# Patient Record
Sex: Male | Born: 1955 | Race: White | Hispanic: No | Marital: Married | Smoking: Current some day smoker
Health system: Southern US, Community
[De-identification: ages and names within clinical notes are randomized; demographics above are authoritative.]

## PROBLEM LIST (undated history)

## (undated) DIAGNOSIS — E785 Hyperlipidemia, unspecified: Secondary | ICD-10-CM

## (undated) DIAGNOSIS — E119 Type 2 diabetes mellitus without complications: Secondary | ICD-10-CM

## (undated) DIAGNOSIS — I509 Heart failure, unspecified: Secondary | ICD-10-CM

## (undated) DIAGNOSIS — I10 Essential (primary) hypertension: Secondary | ICD-10-CM

## (undated) DIAGNOSIS — I639 Cerebral infarction, unspecified: Secondary | ICD-10-CM

## (undated) DIAGNOSIS — I6529 Occlusion and stenosis of unspecified carotid artery: Secondary | ICD-10-CM

## (undated) HISTORY — DX: Occlusion and stenosis of unspecified carotid artery: I65.29

## (undated) HISTORY — DX: Hyperlipidemia, unspecified: E78.5

## (undated) HISTORY — PX: OTHER SURGICAL HISTORY: SHX169

## (undated) HISTORY — PX: CORONARY STENT PLACEMENT: SHX1402

## (undated) HISTORY — DX: Essential (primary) hypertension: I10

## (undated) HISTORY — DX: Type 2 diabetes mellitus without complications: E11.9

## (undated) HISTORY — PX: AORTIC VALVE REPLACEMENT: SHX41

## (undated) HISTORY — PX: HIP SURGERY: SHX245

## (undated) HISTORY — DX: Cerebral infarction, unspecified: I63.9

## (undated) HISTORY — DX: Heart failure, unspecified: I50.9

---

## 2005-03-06 ENCOUNTER — Ambulatory Visit: Payer: Self-pay | Admitting: General Practice

## 2005-03-13 ENCOUNTER — Ambulatory Visit: Payer: Self-pay | Admitting: Psychiatry

## 2005-04-07 ENCOUNTER — Emergency Department (HOSPITAL_COMMUNITY): Admission: EM | Admit: 2005-04-07 | Discharge: 2005-04-07 | Payer: Self-pay | Admitting: Emergency Medicine

## 2005-04-07 ENCOUNTER — Inpatient Hospital Stay: Payer: Self-pay | Admitting: Internal Medicine

## 2005-04-07 ENCOUNTER — Emergency Department: Payer: Self-pay | Admitting: General Practice

## 2006-10-14 ENCOUNTER — Inpatient Hospital Stay: Payer: Self-pay | Admitting: Internal Medicine

## 2008-03-24 ENCOUNTER — Ambulatory Visit: Payer: Self-pay | Admitting: Cardiology

## 2008-03-31 ENCOUNTER — Inpatient Hospital Stay: Payer: Self-pay | Admitting: Cardiology

## 2009-07-20 ENCOUNTER — Ambulatory Visit: Payer: Self-pay | Admitting: Internal Medicine

## 2012-12-22 DIAGNOSIS — M109 Gout, unspecified: Secondary | ICD-10-CM | POA: Insufficient documentation

## 2012-12-22 DIAGNOSIS — Z96649 Presence of unspecified artificial hip joint: Secondary | ICD-10-CM | POA: Insufficient documentation

## 2012-12-22 DIAGNOSIS — Z955 Presence of coronary angioplasty implant and graft: Secondary | ICD-10-CM | POA: Insufficient documentation

## 2012-12-22 DIAGNOSIS — I2510 Atherosclerotic heart disease of native coronary artery without angina pectoris: Secondary | ICD-10-CM | POA: Insufficient documentation

## 2013-02-10 DIAGNOSIS — I4891 Unspecified atrial fibrillation: Secondary | ICD-10-CM | POA: Insufficient documentation

## 2013-02-25 DIAGNOSIS — H53469 Homonymous bilateral field defects, unspecified side: Secondary | ICD-10-CM | POA: Insufficient documentation

## 2013-02-25 DIAGNOSIS — Z95810 Presence of automatic (implantable) cardiac defibrillator: Secondary | ICD-10-CM | POA: Insufficient documentation

## 2013-04-13 DIAGNOSIS — I509 Heart failure, unspecified: Secondary | ICD-10-CM | POA: Insufficient documentation

## 2013-12-21 ENCOUNTER — Encounter: Payer: Self-pay | Admitting: Unknown Physician Specialty

## 2014-01-11 ENCOUNTER — Encounter: Payer: Self-pay | Admitting: Unknown Physician Specialty

## 2014-12-07 ENCOUNTER — Other Ambulatory Visit: Payer: Self-pay | Admitting: Cardiology

## 2014-12-08 ENCOUNTER — Inpatient Hospital Stay: Payer: Self-pay | Admitting: Cardiology

## 2015-01-03 ENCOUNTER — Ambulatory Visit
Admit: 2015-01-03 | Disposition: A | Discharge status: Home / Self Care | Payer: Self-pay | Attending: Internal Medicine | Admitting: Internal Medicine

## 2015-01-12 ENCOUNTER — Ambulatory Visit
Admit: 2015-01-12 | Disposition: A | Discharge status: Home / Self Care | Payer: Self-pay | Attending: Internal Medicine | Admitting: Internal Medicine

## 2015-01-18 ENCOUNTER — Encounter
Admit: 2015-01-18 | Disposition: A | Discharge status: Home / Self Care | Payer: Self-pay | Attending: Cardiology | Admitting: Cardiology

## 2015-02-11 NOTE — Discharge Summary (Signed)
Dates of Admission and Diagnosis:  Date of Admission 08-Dec-2014   Date of Discharge 17-Dec-2014   Admitting Diagnosis Chest pain   Final Diagnosis coronary artery disease   Discharge Diagnosis 1 coronary artery disease   2 ischemic cardiomyopathy   3 prosthetic aortic valve    Chief Complaint/History of Present Illness patient was admitted to [facility] after presenting to our office with complaints of chest pain.  He ruled out from myocardia infarction.  After normalizing he has INR, he underwent left cardiac catheterization which revealed a 90% proximal LAD stenosis.  He underwent percutaneous coronary intervention with placement of a bare metal stent.  He was placed on clopidogrel and resumed warfarin.  He remained on IV heparin until his INR was therapeutic.  He did fairly well during the hospitalization with no further chest pain.  No evidence of heart failure.  No complications from the cardiac catheterization or PCI   Allergies:  Morphine: Other  Pertinent Past History:  Pertinent Past History coronary artery disease Congestive heart failure The aortic valve disease status post aortic valve replacement The AICD placement   Hospital Course:  Hospital Course as per above.  Patient did well pre and post cardiac catheterization.  Remained on IV heparin until his INR normalizes a mildly cardiac catheterization and PCI.  After cardiac catheterization revealed a proximal LAD lesion underwent PCI with placement of a bare metal stent in the proximal LAD.  He did well with this and was placed on clopidogrel in addition to aspirin.  Remained on heparin until his INR  returnedback to the therapeutic range he had no complications from the procedure.   Condition on Discharge Good   Code Status:  Code Status Full Code   DISCHARGE INSTRUCTIONS HOME MEDS:  Medication Reconciliation: Patient's Home Medications at Discharge:     Medication Instructions   finasteride 5 mg oral tablet  take 1 tablet by mouth every day   at 11am   diazepam 5 mg oral tablet  take 1 tablet by mouth every day    aspirin 81 mg oral tablet  1 tab(s) orally once a day (in the evening)   acetaminophen-oxycodone 325 mg-5 mg oral tablet  2 tab(s) orally 3 times a day   oxycontin 20 mg oral tablet, extended release  1 orally 2 times a day, As Needed - for Pain   zolpidem 10 mg oral tablet  1 tab(s) orally once a day (at bedtime), As Needed - for Inability to Sleep   metformin 500 mg oral tablet  1 tab(s) orally 3 times a day   coreg 12.5 mg oral tablet  1 tab(s) orally 2 times a day   lisinopril 20 mg oral tablet  1  orally once a day (in the morning)   nitroglycerin 0.4mg  every 5 minutes; max of 3 tabs in 15 minutes  1 tab(s) sublingual once a day, As Needed - for Chest Pain   celebrex 200 mg oral capsule  1 tab(s) orally once a day   lantus 100 units/ml subcutaneous solution  24 unit(s) subcutaneous once a day (at bedtime)   novolog 100 units/ml subcutaneous solution  per sliding scale    spironolactone 25 mg oral tablet  0.5 tablet orally once a day   tikosyn 500 mcg oral capsule  1 cap(s) orally 2 times a day at 0730 and 1930   plavix 75 mg oral tablet  1 tab(s) orally once a day   promethazine 25 mg oral tablet  1 tab(s) orally once a day, As Needed - for Nausea, Vomiting   nasacort 55 mcg/inh nasal spray  2 spray(s) nasal once a day, As Needed   systane - ophthalmic solution  1 drop(s) to each affected eye 2 times a day, As Needed dry eye    warfarin 1 mg oral tablet  7.5 tab(s) orally once a day   victoza 18 mg/3 ml subcutaneous solution  0.6 milligram(s) subcutaneous once a day (at bedtime)    PRESCRIPTIONS: PRINTED AND PLACED ON CHART  STOP TAKING THE FOLLOWING MEDICATION(S):    cetirizine 10 mg oral capsule: 1 cap(s) orally once a day  Physician's Instructions:  Home Health? No   Treatments None   Dressing Care Replace dressing as necessary.  May  shower.   Diet Carbohydrate Controlled (ADA) Diet   Dietary Supplements None   Diet Consistency Regular Consistency   Activity Limitations No exertional activity  No heavy lifting   Return to Work Not Applicable   Time frame for Follow Up Appointment 1-2 weeks   Electronic Signatures: Teodoro Spray (MD)  (Signed 23-Mar-16 16:05)  Authored: ADMISSION DATE AND DIAGNOSIS, CHIEF COMPLAINT/HPI, Allergies, PERTINENT PAST HISTORY, HOSPITAL COURSE, DISCHARGE INSTRUCTIONS HOME MEDS, PATIENT INSTRUCTIONS   Last Updated: 23-Mar-16 16:05 by Teodoro Spray (MD)

## 2015-02-12 ENCOUNTER — Ambulatory Visit: Payer: Self-pay

## 2015-02-14 ENCOUNTER — Ambulatory Visit: Payer: Self-pay

## 2015-02-15 ENCOUNTER — Ambulatory Visit: Payer: Self-pay

## 2015-02-19 ENCOUNTER — Ambulatory Visit: Payer: Self-pay

## 2015-02-21 ENCOUNTER — Ambulatory Visit: Payer: Self-pay

## 2015-02-22 ENCOUNTER — Ambulatory Visit: Payer: Self-pay

## 2015-02-26 ENCOUNTER — Ambulatory Visit: Payer: Self-pay

## 2015-02-28 ENCOUNTER — Ambulatory Visit: Payer: Self-pay

## 2015-02-28 NOTE — Progress Notes (Signed)
Discharge Summary  Patient Details  Name: BERGEN MAGNER MRN: [id number] Date of Birth: Sep 17, 1956 Referring Provider:  No ref. provider found   Number of Visits: 35  Reason for Discharge:  Early Exit:  Personal  Smoking History:  History  Smoking status  . Not on file  Smokeless tobacco  . Not on file    Diagnosis:  PTCA Initial Exercise Prescription:   Discharge Exercise Prescription:   Functional Capacity:   Psychological, QOL, Others - Outcomes: PHQ 2/9: No flowsheet data found.  Quality of Life:   Personal Goals: Goals established at orientation with interventions provided to work toward goal.    Personal Goals Discharge:   Nutrition & Weight - Outcomes:    Nutrition:   Nutrition Discharge:   Education Questionnaire Score:   Goals reviewed with patient; copy given to patient.

## 2015-02-28 NOTE — Progress Notes (Signed)
Patient only did orientation appt for Cardiac Rehab. I called him today and his wife answered and said he is exercising at home by walking in the neighborhood.

## 2015-03-01 ENCOUNTER — Ambulatory Visit: Payer: Self-pay

## 2015-03-05 ENCOUNTER — Ambulatory Visit: Payer: Self-pay

## 2015-03-07 ENCOUNTER — Ambulatory Visit: Payer: Self-pay

## 2015-03-08 ENCOUNTER — Encounter: Payer: Self-pay | Admitting: *Deleted

## 2015-03-08 ENCOUNTER — Ambulatory Visit: Payer: Self-pay

## 2015-03-14 ENCOUNTER — Ambulatory Visit: Payer: Self-pay

## 2015-03-15 ENCOUNTER — Ambulatory Visit: Payer: Self-pay

## 2015-03-19 ENCOUNTER — Ambulatory Visit: Payer: Self-pay

## 2015-03-21 ENCOUNTER — Ambulatory Visit: Payer: Self-pay

## 2015-03-22 ENCOUNTER — Ambulatory Visit: Payer: Self-pay

## 2015-03-26 ENCOUNTER — Ambulatory Visit: Payer: Self-pay

## 2015-03-28 ENCOUNTER — Ambulatory Visit: Payer: Self-pay

## 2015-03-29 ENCOUNTER — Ambulatory Visit: Payer: Self-pay

## 2015-04-02 ENCOUNTER — Ambulatory Visit: Payer: Self-pay

## 2015-04-04 ENCOUNTER — Ambulatory Visit: Payer: Self-pay

## 2015-04-05 ENCOUNTER — Ambulatory Visit: Payer: Self-pay

## 2015-04-09 ENCOUNTER — Ambulatory Visit: Payer: Self-pay

## 2015-04-11 ENCOUNTER — Ambulatory Visit: Payer: Self-pay

## 2015-04-12 ENCOUNTER — Ambulatory Visit: Payer: Self-pay

## 2016-03-06 DIAGNOSIS — E1140 Type 2 diabetes mellitus with diabetic neuropathy, unspecified: Secondary | ICD-10-CM | POA: Insufficient documentation

## 2016-04-02 DIAGNOSIS — E782 Mixed hyperlipidemia: Secondary | ICD-10-CM | POA: Insufficient documentation

## 2016-07-04 ENCOUNTER — Other Ambulatory Visit: Payer: Self-pay | Admitting: Internal Medicine

## 2016-07-04 DIAGNOSIS — M48061 Spinal stenosis, lumbar region without neurogenic claudication: Secondary | ICD-10-CM

## 2016-07-10 ENCOUNTER — Ambulatory Visit
Admission: RE | Admit: 2016-07-10 | Discharge: 2016-07-10 | Disposition: A | Discharge status: Home / Self Care | Payer: Managed Care, Other (non HMO) | Source: Ambulatory Visit | Attending: Internal Medicine | Admitting: Internal Medicine

## 2016-07-10 DIAGNOSIS — M5137 Other intervertebral disc degeneration, lumbosacral region: Secondary | ICD-10-CM | POA: Insufficient documentation

## 2016-07-10 DIAGNOSIS — M48061 Spinal stenosis, lumbar region without neurogenic claudication: Secondary | ICD-10-CM

## 2016-07-10 DIAGNOSIS — M4806 Spinal stenosis, lumbar region: Secondary | ICD-10-CM | POA: Insufficient documentation

## 2016-08-11 ENCOUNTER — Encounter (INDEPENDENT_AMBULATORY_CARE_PROVIDER_SITE_OTHER): Payer: Managed Care, Other (non HMO) | Admitting: Vascular Surgery

## 2016-08-11 ENCOUNTER — Encounter (INDEPENDENT_AMBULATORY_CARE_PROVIDER_SITE_OTHER): Payer: Managed Care, Other (non HMO)

## 2016-08-11 ENCOUNTER — Encounter (INDEPENDENT_AMBULATORY_CARE_PROVIDER_SITE_OTHER): Payer: Self-pay | Admitting: Vascular Surgery

## 2016-09-15 ENCOUNTER — Ambulatory Visit (INDEPENDENT_AMBULATORY_CARE_PROVIDER_SITE_OTHER): Payer: Managed Care, Other (non HMO)

## 2016-09-15 ENCOUNTER — Encounter (INDEPENDENT_AMBULATORY_CARE_PROVIDER_SITE_OTHER): Payer: Self-pay | Admitting: Vascular Surgery

## 2016-09-15 ENCOUNTER — Ambulatory Visit (INDEPENDENT_AMBULATORY_CARE_PROVIDER_SITE_OTHER): Payer: Managed Care, Other (non HMO) | Admitting: Vascular Surgery

## 2016-09-15 VITALS — BP 152/89 | HR 93 | Resp 17 | Ht 66.0 in | Wt 195.0 lb

## 2016-09-15 DIAGNOSIS — M79604 Pain in right leg: Secondary | ICD-10-CM | POA: Diagnosis not present

## 2016-09-15 DIAGNOSIS — M79605 Pain in left leg: Secondary | ICD-10-CM | POA: Diagnosis not present

## 2016-09-15 DIAGNOSIS — M4726 Other spondylosis with radiculopathy, lumbar region: Secondary | ICD-10-CM | POA: Diagnosis not present

## 2016-09-15 DIAGNOSIS — M79606 Pain in leg, unspecified: Secondary | ICD-10-CM | POA: Insufficient documentation

## 2016-09-15 DIAGNOSIS — I739 Peripheral vascular disease, unspecified: Secondary | ICD-10-CM

## 2016-09-15 DIAGNOSIS — M47816 Spondylosis without myelopathy or radiculopathy, lumbar region: Secondary | ICD-10-CM | POA: Insufficient documentation

## 2016-09-15 NOTE — Progress Notes (Signed)
MRN : [id number]  Michael Patrick is a 60 y.o. (05-03-1956) male who presents with chief complaint of  Chief Complaint  Patient presents with  . New Patient (Initial Visit)  .  History of Present Illness: The patient is seen for evaluation of painful lower extremities. Patient notes the pain is variable and not always associated with activity.  The pain is somewhat consistent day to day occurring on most days. The patient notes the pain also occurs with standing and routinely seems worse as the day wears on. He notes the pain is much less when he is up and walking or working in the yard.  Sitting in a recliner is the worst.  The pain has been progressive over the past several years. The patient states these symptoms are causing  a profound negative impact on quality of life and daily activities.  The patient denies rest pain or dangling of an extremity off the side of the bed during the night for relief. No open wounds or sores at this time. No history of DVT or phlebitis. No prior interventions or surgeries.  There is a  history of back problems and DJD of the lumbar and sacral spine. This is currently under evaluation.  ABI's Rt=0.94 and Lt=0.71 with biphasic signal bilaterally   Current Meds  Medication Sig  . aspirin EC 81 MG tablet Take by mouth.  . carvedilol (COREG) 12.5 MG tablet   . celecoxib (CELEBREX) 200 MG capsule TAKE ONE TABLET BY MOUTH EVERY DAY  . Cinnamon Bark POWD by Does not apply route.  . clopidogrel (PLAVIX) 75 MG tablet TAKE ONE TABLET BY MOUTH EVERY MORNING  . Cyanocobalamin (VITAMIN B-12) 5000 MCG SUBL Place under the tongue.  . diazepam (VALIUM) 5 MG tablet Take by mouth.  . Dulaglutide 0.75 MG/0.5ML SOPN Inject into the skin.  . finasteride (PROSCAR) 5 MG tablet TAKE ONE TABLET BY MOUTH EVERY DAY  . lisinopril (PRINIVIL,ZESTRIL) 40 MG tablet TAKE ONE TABLET EVERY DAY  . metFORMIN (GLUCOPHAGE-XR) 500 MG 24 hr tablet 4 TABLETS BY MOUTH AT BEDTIME  .  niacin 250 MG tablet Take by mouth.  . nitroGLYCERIN (NITROSTAT) 0.4 MG SL tablet PLACE 1 TABLET UNDER TONGUE EVERY 5 MIN AS NEEDED FOR CHEST PAIN IF NO RELIEF IN15 MIN CALL 911 (MAX 3 TABS)  . ondansetron (ZOFRAN) 4 MG tablet Take by mouth.  . oxyCODONE-acetaminophen (PERCOCET/ROXICET) 5-325 MG tablet   . OXYCONTIN 20 MG 12 hr tablet   . Polyethyl Glycol-Propyl Glycol 0.4-0.3 % SOLN Apply to eye.  . promethazine (PHENERGAN) 25 MG tablet Take by mouth.  . spironolactone (ALDACTONE) 25 MG tablet   . TIKOSYN 500 MCG capsule   . warfarin (COUMADIN) 1 MG tablet TAKE ONE TABLET AT BEDTIME  . warfarin (COUMADIN) 6 MG tablet     Past Medical History:  Diagnosis Date  . Carotid artery occlusion   . CHF (congestive heart failure) ([place])   . Diabetes mellitus without complication ([place])   . Hyperlipidemia   . Hypertension   . Stroke [facility])     Past Surgical History:  Procedure Laterality Date  . AORTIC VALVE REPLACEMENT    . CORONARY STENT PLACEMENT    . HIP SURGERY      Social History Social History  Substance Use Topics  . Smoking status: Former Research scientist (life sciences)  . Smokeless tobacco: Never Used  . Alcohol use Yes     Comment: occassionally    Family History Family History  Problem Relation  Age of Onset  . Hypertension Mother   . Hypertension Father   No family history of bleeding/clotting disorders, porphyria or autoimmune disease   Allergies  Allergen Reactions  . Morphine And Related      REVIEW OF SYSTEMS (Negative unless checked)  Constitutional: [] Weight loss  [] Fever  [] Chills Cardiac: [] Chest pain   [] Chest pressure   [] Palpitations   [] Shortness of breath when laying flat   [] Shortness of breath with exertion. Vascular:  [] Pain in legs with walking   [x] Pain in legs at rest  [] History of DVT   [] Phlebitis   [] Swelling in legs   [] Varicose veins   [] Non-healing ulcers Pulmonary:   [] Uses home oxygen   [] Productive cough   [] Hemoptysis   [] Wheeze  [] COPD    [] Asthma Neurologic:  [] Dizziness   [] Seizures   [x] History of stroke   [x] History of TIA  [] Aphasia   [] Vissual changes   [] Weakness or numbness in arm   [] Weakness or numbness in leg Musculoskeletal:   [] Joint swelling   [x] Joint pain   [x] Low back pain Hematologic:  [] Easy bruising  [] Easy bleeding   [] Hypercoagulable state   [] Anemic Gastrointestinal:  [] Diarrhea   [] Vomiting  [] Gastroesophageal reflux/heartburn   [] Difficulty swallowing. Genitourinary:  [] Chronic kidney disease   [] Difficult urination  [] Frequent urination   [] Blood in urine Skin:  [] Rashes   [] Ulcers  Psychological:  [] History of anxiety   []  History of major depression.  Physical Examination  Vitals:   09/15/16 1509  BP: (!) 152/89  Pulse: 93  Resp: 17  Weight: 195 lb (88.5 kg)  Height: 5\' 6"  (1.676 m)   Body mass index is 31.47 kg/m. Gen: WD/WN, NAD Head: [place]/AT, No temporalis wasting.  Ear/Nose/Throat: Hearing grossly intact, nares w/o erythema or drainage, poor dentition Eyes: PER, EOMI, sclera nonicteric.  Neck: Supple, no masses.  No bruit or JVD.  Pulmonary:  Good air movement, clear to auscultation bilaterally, no use of accessory muscles.  Cardiac: RRR, normal S1, S2, no Murmurs. Vascular:  Mild edema noted bilaterally Vessel Right Left  Radial Palpable Palpable  Ulnar Palpable Palpable  Brachial Palpable Palpable  Carotid Palpable Palpable  Femoral Palpable Palpable  Popliteal Palpable Palpable  PT Not Palpable Not Palpable  DP Not Palpable Not Palpable   Gastrointestinal: soft, non-distended. No guarding/no peritoneal signs.  Musculoskeletal: M/S 5/5 throughout.  No deformity or atrophy.  Neurologic: CN 2-12 intact. Pain and light touch intact in extremities.  Symmetrical.  Speech is fluent. Motor exam as listed above. Psychiatric: Judgment intact, Mood & affect appropriate for pt's clinical situation. Dermatologic: No rashes or ulcers noted.  No changes consistent with cellulitis. Lymph :  No Cervical lymphadenopathy, no lichenification or skin changes of chronic lymphedema.  CBC No results found for: WBC, HGB, HCT, MCV, PLT  BMET No results found for: NA, K, CL, CO2, GLUCOSE, BUN, CREATININE, CALCIUM, GFRNONAA, GFRAA CrCl cannot be calculated (No order found.).  COAG No results found for: INR, PROTIME  Radiology No results found.  Assessment/Plan 1. PAD (peripheral artery disease) (HCC)  Recommend:  The patient has atypical pain symptoms for pure atherosclerotic disease. However, on physical exam there is evidence of arterial disease, given the diminished pulses.  Noninvasive studies including ABI's and possibly arterial ultrasound of the legs will be obtained and the patient will follow up with me to review these studies. (see #2)  The patient should continue walking and begin a more formal exercise program. The patient should continue his antiplatelet therapy  and aggressive treatment of the lipid abnormalities.  The patient should begin wearing graduated compression socks 15-20 mmHg strength to control edema.   2. Pain in both lower extremities Uncertain etiology  He is already scheduled for Nerve conduction studies  And before I move forward with vascular ultraounds I will review these results.  3. Osteoarthritis of spine with radiculopathy, lumbar region See #1 and 2    [name], MD  09/15/2016 5:04 PM

## 2016-09-19 ENCOUNTER — Encounter (INDEPENDENT_AMBULATORY_CARE_PROVIDER_SITE_OTHER): Payer: Self-pay | Admitting: Vascular Surgery

## 2016-09-19 ENCOUNTER — Encounter (INDEPENDENT_AMBULATORY_CARE_PROVIDER_SITE_OTHER): Payer: Self-pay

## 2017-01-19 DIAGNOSIS — E1142 Type 2 diabetes mellitus with diabetic polyneuropathy: Secondary | ICD-10-CM | POA: Insufficient documentation

## 2017-01-19 DIAGNOSIS — F1190 Opioid use, unspecified, uncomplicated: Secondary | ICD-10-CM | POA: Insufficient documentation

## 2017-01-19 DIAGNOSIS — Z8673 Personal history of transient ischemic attack (TIA), and cerebral infarction without residual deficits: Secondary | ICD-10-CM | POA: Insufficient documentation

## 2017-02-25 DIAGNOSIS — Z85820 Personal history of malignant melanoma of skin: Secondary | ICD-10-CM | POA: Insufficient documentation

## 2017-08-21 ENCOUNTER — Other Ambulatory Visit: Payer: Self-pay | Admitting: Unknown Physician Specialty

## 2017-08-21 DIAGNOSIS — M542 Cervicalgia: Principal | ICD-10-CM

## 2017-08-21 DIAGNOSIS — M5440 Lumbago with sciatica, unspecified side: Secondary | ICD-10-CM

## 2017-08-21 DIAGNOSIS — G8929 Other chronic pain: Secondary | ICD-10-CM

## 2017-08-31 ENCOUNTER — Other Ambulatory Visit: Payer: Self-pay | Admitting: Unknown Physician Specialty

## 2017-08-31 ENCOUNTER — Ambulatory Visit
Admission: RE | Admit: 2017-08-31 | Discharge: 2017-08-31 | Disposition: A | Discharge status: Home / Self Care | Payer: Medicare Other | Source: Ambulatory Visit | Attending: Unknown Physician Specialty | Admitting: Unknown Physician Specialty

## 2017-08-31 ENCOUNTER — Encounter
Admission: RE | Admit: 2017-08-31 | Discharge: 2017-08-31 | Disposition: A | Discharge status: Home / Self Care | Payer: Medicare Other | Source: Ambulatory Visit | Attending: Unknown Physician Specialty | Admitting: Unknown Physician Specialty

## 2017-08-31 DIAGNOSIS — M542 Cervicalgia: Principal | ICD-10-CM

## 2017-08-31 DIAGNOSIS — M5440 Lumbago with sciatica, unspecified side: Secondary | ICD-10-CM | POA: Diagnosis not present

## 2017-08-31 DIAGNOSIS — M4802 Spinal stenosis, cervical region: Secondary | ICD-10-CM | POA: Insufficient documentation

## 2017-08-31 DIAGNOSIS — Z96643 Presence of artificial hip joint, bilateral: Secondary | ICD-10-CM

## 2017-08-31 DIAGNOSIS — G8929 Other chronic pain: Secondary | ICD-10-CM

## 2017-08-31 DIAGNOSIS — M5136 Other intervertebral disc degeneration, lumbar region: Secondary | ICD-10-CM | POA: Insufficient documentation

## 2017-08-31 DIAGNOSIS — M5137 Other intervertebral disc degeneration, lumbosacral region: Secondary | ICD-10-CM | POA: Diagnosis not present

## 2017-08-31 DIAGNOSIS — M48061 Spinal stenosis, lumbar region without neurogenic claudication: Secondary | ICD-10-CM | POA: Insufficient documentation

## 2017-08-31 MED ORDER — TECHNETIUM TC 99M MEDRONATE IV KIT
22.7600 | PACK | Freq: Once | INTRAVENOUS | Status: AC | PRN
  Administered 2017-08-31: 22.76 via INTRAVENOUS

## 2017-10-08 ENCOUNTER — Encounter (INDEPENDENT_AMBULATORY_CARE_PROVIDER_SITE_OTHER): Payer: Self-pay

## 2017-10-08 ENCOUNTER — Ambulatory Visit (INDEPENDENT_AMBULATORY_CARE_PROVIDER_SITE_OTHER): Payer: Medicare Other | Admitting: Vascular Surgery

## 2017-10-08 ENCOUNTER — Encounter (INDEPENDENT_AMBULATORY_CARE_PROVIDER_SITE_OTHER): Payer: Self-pay | Admitting: Vascular Surgery

## 2017-10-08 VITALS — BP 134/83 | HR 89 | Resp 16 | Ht 66.5 in | Wt 207.0 lb

## 2017-10-08 DIAGNOSIS — I70213 Atherosclerosis of native arteries of extremities with intermittent claudication, bilateral legs: Secondary | ICD-10-CM

## 2017-10-08 DIAGNOSIS — E119 Type 2 diabetes mellitus without complications: Secondary | ICD-10-CM | POA: Insufficient documentation

## 2017-10-08 DIAGNOSIS — M79606 Pain in leg, unspecified: Secondary | ICD-10-CM

## 2017-10-08 DIAGNOSIS — I70219 Atherosclerosis of native arteries of extremities with intermittent claudication, unspecified extremity: Secondary | ICD-10-CM | POA: Insufficient documentation

## 2017-10-08 DIAGNOSIS — M4726 Other spondylosis with radiculopathy, lumbar region: Secondary | ICD-10-CM

## 2017-10-08 DIAGNOSIS — E1151 Type 2 diabetes mellitus with diabetic peripheral angiopathy without gangrene: Secondary | ICD-10-CM

## 2017-10-08 DIAGNOSIS — I10 Essential (primary) hypertension: Secondary | ICD-10-CM | POA: Insufficient documentation

## 2017-10-26 ENCOUNTER — Encounter (INDEPENDENT_AMBULATORY_CARE_PROVIDER_SITE_OTHER): Payer: Self-pay | Admitting: Vascular Surgery

## 2017-10-26 NOTE — Progress Notes (Signed)
Patient ID: Michael Patrick, male   DOB: 1956/04/26, 62 y.o.   MRN: [id number]  Chief Complaint  Patient presents with  . Follow-up    PAD has worsened    HPI JOHNCARLO Patrick is a 62 y.o. male.  Past Medical History:  Diagnosis Date  . Carotid artery occlusion   . CHF (congestive heart failure) ([place])   . Diabetes mellitus without complication ([place])   . Hyperlipidemia   . Hypertension   . Stroke [facility])     Past Surgical History:  Procedure Laterality Date  . AORTIC VALVE REPLACEMENT    . CORONARY STENT PLACEMENT    . HIP SURGERY        Allergies  Allergen Reactions  . Morphine And Related     Current Outpatient Medications  Medication Sig Dispense Refill  . carvedilol (COREG) 12.5 MG tablet     . celecoxib (CELEBREX) 200 MG capsule TAKE ONE TABLET BY MOUTH EVERY DAY    . Cinnamon Bark POWD by Does not apply route.    . clopidogrel (PLAVIX) 75 MG tablet TAKE ONE TABLET BY MOUTH EVERY MORNING    . Cyanocobalamin (VITAMIN B-12) 5000 MCG SUBL Place under the tongue.    . diazepam (VALIUM) 5 MG tablet Take by mouth.    . Dulaglutide 0.75 MG/0.5ML SOPN Inject into the skin.    . Exenatide ER (BYDUREON) 2 MG PEN Inject into the skin.    . finasteride (PROSCAR) 5 MG tablet TAKE ONE TABLET BY MOUTH EVERY DAY    . furosemide (LASIX) 40 MG tablet Take by mouth.    . gabapentin (NEURONTIN) 800 MG tablet Take by mouth.    Marland Kitchen glimepiride (AMARYL) 4 MG tablet Take by mouth.    Marland Kitchen lisinopril (PRINIVIL,ZESTRIL) 40 MG tablet TAKE ONE TABLET EVERY DAY    . magnesium oxide (MAG-OX) 400 MG tablet Take 400 mg by mouth daily.    . metFORMIN (GLUCOPHAGE-XR) 500 MG 24 hr tablet 4 TABLETS BY MOUTH AT BEDTIME    . niacin 250 MG tablet Take by mouth.    . nitroGLYCERIN (NITROSTAT) 0.4 MG SL tablet PLACE 1 TABLET UNDER TONGUE EVERY 5 MIN AS NEEDED FOR CHEST PAIN IF NO RELIEF IN15 MIN CALL 911 (MAX 3 TABS)    . oxyCODONE-acetaminophen (PERCOCET/ROXICET) 5-325 MG tablet     . OXYCONTIN 20 MG  12 hr tablet     . Polyethyl Glycol-Propyl Glycol 0.4-0.3 % SOLN Apply to eye.    . promethazine (PHENERGAN) 25 MG tablet Take by mouth.    . spironolactone (ALDACTONE) 25 MG tablet     . TIKOSYN 500 MCG capsule     . warfarin (COUMADIN) 1 MG tablet TAKE ONE TABLET AT BEDTIME    . warfarin (COUMADIN) 6 MG tablet     . aspirin EC 81 MG tablet Take by mouth.    . ondansetron (ZOFRAN) 4 MG tablet Take by mouth.    . topiramate (TOPAMAX) 25 MG tablet TAKE 3 TABLETS (75 MG TOTAL) BY MOUTH 2 (TWO) TIMES DAILY  1   No current facility-administered medications for this visit.         Physical Exam BP 134/83 (BP Location: Right Arm, Patient Position: Sitting)   Pulse 89   Resp 16   Ht 5' 6.5" (1.689 m)   Wt 207 lb (93.9 kg)   BMI 32.91 kg/m  Gen:  WD/WN, NAD Skin: incision C/D/I     Assessment/Plan:  No problem-specific Assessment &  Plan notes found for this encounter.      Michael Patrick 10/26/2017, 8:21 PM   This note was created with Dragon medical transcription system.  Any errors from dictation are unintentional.

## 2017-12-03 DIAGNOSIS — Z0000 Encounter for general adult medical examination without abnormal findings: Secondary | ICD-10-CM | POA: Insufficient documentation

## 2018-03-31 ENCOUNTER — Other Ambulatory Visit
Admission: RE | Admit: 2018-03-31 | Discharge: 2018-03-31 | Disposition: A | Discharge status: Home / Self Care | Payer: Medicare Other | Source: Ambulatory Visit | Attending: Gastroenterology | Admitting: Gastroenterology

## 2018-03-31 ENCOUNTER — Other Ambulatory Visit: Payer: Self-pay

## 2018-03-31 DIAGNOSIS — R197 Diarrhea, unspecified: Secondary | ICD-10-CM | POA: Diagnosis present

## 2018-03-31 LAB — GASTROINTESTINAL PANEL BY PCR, STOOL (REPLACES STOOL CULTURE)

## 2018-03-31 LAB — C DIFFICILE QUICK SCREEN W PCR REFLEX
C DIFFICILE (CDIFF) INTERP: NOT DETECTED
C DIFFICILE (CDIFF) TOXIN: NEGATIVE
C Diff antigen: NEGATIVE

## 2018-04-01 ENCOUNTER — Other Ambulatory Visit: Payer: Self-pay | Admitting: Gastroenterology

## 2018-04-01 DIAGNOSIS — R1032 Left lower quadrant pain: Secondary | ICD-10-CM

## 2018-04-01 DIAGNOSIS — R197 Diarrhea, unspecified: Secondary | ICD-10-CM

## 2018-04-01 DIAGNOSIS — R100 Acute abdomen: Secondary | ICD-10-CM

## 2018-04-02 ENCOUNTER — Ambulatory Visit
Admission: RE | Admit: 2018-04-02 | Discharge: 2018-04-02 | Disposition: A | Discharge status: Home / Self Care | Payer: Medicare Other | Source: Ambulatory Visit | Attending: Gastroenterology | Admitting: Gastroenterology

## 2018-04-02 DIAGNOSIS — R197 Diarrhea, unspecified: Secondary | ICD-10-CM | POA: Diagnosis present

## 2018-04-02 DIAGNOSIS — Z96643 Presence of artificial hip joint, bilateral: Secondary | ICD-10-CM | POA: Insufficient documentation

## 2018-04-02 DIAGNOSIS — N200 Calculus of kidney: Secondary | ICD-10-CM | POA: Insufficient documentation

## 2018-04-02 DIAGNOSIS — R1032 Left lower quadrant pain: Secondary | ICD-10-CM | POA: Insufficient documentation

## 2018-04-02 DIAGNOSIS — R100 Acute abdomen: Secondary | ICD-10-CM

## 2018-04-02 DIAGNOSIS — I700 Atherosclerosis of aorta: Secondary | ICD-10-CM | POA: Insufficient documentation

## 2018-04-02 MED ORDER — IOPAMIDOL (ISOVUE-300) INJECTION 61%
100.0000 mL | Freq: Once | INTRAVENOUS | Status: AC | PRN
  Administered 2018-04-02: 100 mL via INTRAVENOUS

## 2018-04-05 NOTE — Progress Notes (Signed)
04/06/2018 7:31 PM   Michael Patrick May 25, 1956 [id number]  Referring provider: Rusty Aus, MD [place] [place], [place] [address]  Chief Complaint  Patient presents with  . Nephrolithiasis    New Patient    HPI: Patient is 62 year old Caucasian male with a history of nephrolithiasis who is referred by Ok Edwards, NPfor further evaluation and management.  Patient recently suffered an episode of possible food poisoning 1 month ago.  He was having nausea, vomiting and diarrhea associated with intermittent periumbilical abdominal pain.  Contrast CT on 04/02/2018 noted bilateral renal calculi but no obstructing ureteral calculi or bladder calculi.  Patient was found to have microscopic hematuria at today's visit.      He does not have a prior history of recurrent urinary tract infections, nephrolithiasis, trauma to the genitourinary tract, BPH or malignancies of the genitourinary tract.   His father had bladder cancer and he was a smoker.  He does not have a family medical history of nephrolithiasis or hematuria.   He is having symptoms of nocturia.  Patient denies any gross hematuria, dysuria or suprapubic/flank pain.  Patient denies any fevers, chills, nausea or vomiting.    He is a smoker.  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   He  has a high BMI.    He underwent a cystoscopy with Dr. Eliberto Ivory in 1998 for hematuria and was placed on finasteride.  He also is very concerned about his erectile dysfunction.  He stated he had tried PDE 5 inhibitors in the past and they were not effective.  He denied any curvature or pain with erections.  PMH: Past Medical History:  Diagnosis Date  . Carotid artery occlusion   . CHF (congestive heart failure) ([place])   . Diabetes mellitus without complication ([place])   . Hyperlipidemia   . Hypertension   . Stroke [facility])     Surgical History: Past  Surgical History:  Procedure Laterality Date  . AORTIC VALVE REPLACEMENT    . CORONARY STENT PLACEMENT    . HIP SURGERY      Home Medications:  Allergies as of 04/06/2018      Reactions   Morphine And Related       Medication List        Accurate as of 04/06/18  7:31 PM. Always use your most recent med list.          aspirin EC 81 MG tablet Take by mouth.   BYDUREON 2 MG Pen Generic drug:  Exenatide ER Inject into the skin.   carvedilol 12.5 MG tablet Commonly known as:  COREG   celecoxib 200 MG capsule Commonly known as:  CELEBREX TAKE ONE TABLET BY MOUTH EVERY DAY   Cinnamon Bark Powd by Does not apply route.   clopidogrel 75 MG tablet Commonly known as:  PLAVIX TAKE ONE TABLET BY MOUTH EVERY MORNING   diazepam 5 MG tablet Commonly known as:  VALIUM Take by mouth.   Dulaglutide 0.75 MG/0.5ML Sopn Inject into the skin.   finasteride 5 MG tablet Commonly known as:  PROSCAR TAKE ONE TABLET BY MOUTH EVERY DAY   furosemide 40 MG tablet Commonly known as:  LASIX Take by mouth.   gabapentin 800 MG tablet Commonly known as:  NEURONTIN Take by mouth.   glimepiride 4 MG tablet Commonly known as:  AMARYL Take by mouth.   lisinopril 40 MG tablet Commonly known as:  PRINIVIL,ZESTRIL TAKE ONE  TABLET EVERY DAY   magnesium oxide 400 MG tablet Commonly known as:  MAG-OX Take 400 mg by mouth daily.   metFORMIN 500 MG 24 hr tablet Commonly known as:  GLUCOPHAGE-XR 4 TABLETS BY MOUTH AT BEDTIME   niacin 250 MG tablet Take by mouth.   NITROSTAT 0.4 MG SL tablet Generic drug:  nitroGLYCERIN PLACE 1 TABLET UNDER TONGUE EVERY 5 MIN AS NEEDED FOR CHEST PAIN IF NO RELIEF IN15 MIN CALL 911 (MAX 3 TABS)   ondansetron 4 MG tablet Commonly known as:  ZOFRAN Take by mouth.   oxyCODONE-acetaminophen 5-325 MG tablet Commonly known as:  PERCOCET/ROXICET   OXYCONTIN 20 mg 12 hr tablet Generic drug:  oxyCODONE   pantoprazole 40 MG tablet Commonly known as:   PROTONIX Take 40 mg by mouth daily.   Polyethyl Glycol-Propyl Glycol 0.4-0.3 % Soln Apply to eye.   promethazine 25 MG tablet Commonly known as:  PHENERGAN Take by mouth.   spironolactone 25 MG tablet Commonly known as:  ALDACTONE   TIKOSYN 500 MCG capsule Generic drug:  dofetilide   topiramate 25 MG tablet Commonly known as:  TOPAMAX TAKE 3 TABLETS (75 MG TOTAL) BY MOUTH 2 (TWO) TIMES DAILY   Vitamin B-12 5000 MCG Subl Place under the tongue.   warfarin 1 MG tablet Commonly known as:  COUMADIN TAKE ONE TABLET AT BEDTIME   warfarin 6 MG tablet Commonly known as:  COUMADIN       Allergies:  Allergies  Allergen Reactions  . Morphine And Related     Family History: Family History  Problem Relation Age of Onset  . Hypertension Mother   . Hypertension Father     Social History:  reports that he has quit smoking. He has never used smokeless tobacco. He reports that he drinks alcohol. He reports that he does not use drugs.  ROS: UROLOGY Frequent Urination?: No Hard to postpone urination?: No Burning/pain with urination?: No Get up at night to urinate?: Yes Leakage of urine?: No Urine stream starts and stops?: No Trouble starting stream?: No Do you have to strain to urinate?: No Blood in urine?: No Urinary tract infection?: No Sexually transmitted disease?: No Injury to kidneys or bladder?: No Painful intercourse?: No Weak stream?: No Erection problems?: Yes Penile pain?: No  Gastrointestinal Nausea?: No Vomiting?: No Indigestion/heartburn?: No Diarrhea?: No Constipation?: No  Constitutional Fever: No Night sweats?: No Weight loss?: No Fatigue?: No  Skin Skin rash/lesions?: No Itching?: No  Eyes Blurred vision?: No Double vision?: No  Ears/Nose/Throat Sore throat?: No Sinus problems?: No  Hematologic/Lymphatic Swollen glands?: No Easy bruising?: Yes  Cardiovascular Leg swelling?: No Chest pain?: No  Respiratory Cough?:  No Shortness of breath?: No  Endocrine Excessive thirst?: No  Musculoskeletal Back pain?: Yes Joint pain?: No  Neurological Headaches?: No Dizziness?: No  Psychologic Depression?: No Anxiety?: No  Physical Exam: BP 99/64   Pulse 99   Ht 5\' 6"  (1.676 m)   Wt 193 lb (87.5 kg)   BMI 31.15 kg/m   Constitutional:  Well nourished. Alert and oriented, No acute distress. HEENT: [place] AT, moist mucus membranes.  Trachea midline, no masses. Cardiovascular: No clubbing, cyanosis, or edema. Respiratory: Normal respiratory effort, no increased work of breathing. GI: Abdomen is soft, non tender, non distended, no abdominal masses. Liver and spleen not palpable.  No hernias appreciated.  Stool sample for occult testing is not indicated.   GU: No CVA tenderness.  No bladder fullness or masses.  Patient with circumcised  phallus.   Urethral meatus is patent.  No penile discharge. No penile lesions or rashes. Scrotum without lesions, cysts, rashes and/or edema.  Testicles are located scrotally bilaterally. No masses are appreciated in the testicles. Left and right epididymis are normal. Rectal: Patient with  normal sphincter tone. Anus and perineum without scarring or rashes. No rectal masses are appreciated. Prostate is approximately 25 grams, no nodules are appreciated. Seminal vesicles are normal. Skin: No rashes, bruises or suspicious lesions. Lymph: No cervical or inguinal adenopathy. Neurologic: Grossly intact, no focal deficits, moving all 4 extremities. Psychiatric: Normal mood and affect.  Laboratory Data: PSA 0.11 in 09/2016  No results found for: WBC, HGB, HCT, MCV, PLT  No results found for: CREATININE  No results found for: PSA  No results found for: TESTOSTERONE  No results found for: HGBA1C  No results found for: TSH  No results found for: CHOL, HDL, CHOLHDL, VLDL, LDLCALC  No results found for: AST No results found for: ALT No components found for:  ALKALINEPHOPHATASE No components found for: BILIRUBINTOTAL  No results found for: ESTRADIOL  Urinalysis 11-30 RBC's.  See Epic.   I have reviewed the labs.   Pertinent Imaging: CLINICAL DATA:  History of food poisoning 3 weeks ago. Persistent diffuse abdominal pain with nausea, vomiting and diarrhea.  EXAM: CT ABDOMEN AND PELVIS WITH CONTRAST  TECHNIQUE: Multidetector CT imaging of the abdomen and pelvis was performed using the standard protocol following bolus administration of intravenous contrast.  CONTRAST:  179mL ISOVUE-300 IOPAMIDOL (ISOVUE-300) INJECTION 61%  COMPARISON:  None.  FINDINGS: Lower chest: The lung bases are clear. No pleural effusion. The heart is normal in size. No pericardial effusion. Pacer wires are noted.  Hepatobiliary: No focal hepatic lesions or intrahepatic biliary dilatation. The gallbladder is normal. No common bile duct dilatation.  Pancreas: No mass, inflammation or ductal dilatation.  Spleen: Normal size.  No focal lesions.  Adrenals/Urinary Tract: The adrenal glands are normal.  Bilateral renal calculi are noted but no obstructing ureteral calculi or bladder calculi. Both kidneys demonstrate normal enhancement/perfusion. No worrisome renal lesions. The delayed images do not demonstrate any significant collecting system abnormalities. The bladder is normal.  Stomach/Bowel: The stomach, duodenum, small bowel and colon are unremarkable. No acute inflammatory changes, mass lesions or obstructive findings. The terminal ileum and appendix are normal.  Vascular/Lymphatic: Moderate to advanced atherosclerotic calcifications involving the aorta and branch vessels and iliac arteries and branch vessels. No aneurysm or dissection. The branch vessels are patent. The major venous structures are patent.  Small scattered mesenteric and retroperitoneal lymph nodes but no mass or adenopathy.  Reproductive: The prostate gland  and seminal vesicles are unremarkable.  Other: No pelvic mass or adenopathy. No free pelvic fluid collections. No inguinal mass or adenopathy.  Musculoskeletal: Bilateral total hip arthroplasties with associated significant artifact. 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 No acute bony findings or worrisome bone lesions. Stable sclerotic lesion involving the right iliac bone, likely benign bone island. Stable advanced facet disease involving the lumbar spine.  IMPRESSION: 1. No acute abdominal/pelvic findings, mass lesions or adenopathy. 2. Bilateral renal calculi but no obstructing ureteral calculi or bladder calculi. 3. Age advanced atherosclerotic calcifications. 4. 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   Electronically Signed   By: Marijo Sanes M.D.   On: 04/02/2018 14:01  I have independently reviewed the films.    Assessment & Plan:   1.  Microscopic hematuria I explained to the  patient that there are a number of causes that can be associated with blood in the urine, such as stones, BPH, UTI's, damage to the urinary tract and/or cancer. At this time, I felt that the patient warranted further urologic evaluation with 3 or greater RBC's/hpf on microscopic evaluation of the urine.  The AUA guidelines state that a CT urogram is the preferred imaging study to evaluate hematuria.- explained that the CT he had on 04/02/2018 was not a CTU and may miss some urological tumors I explained to the patient that a contrast material will be injected into a vein and that in rare instances, an allergic reaction can result and may even life threatening   The patient denies any allergies to contrast, iodine and/or seafood and is taking metformin. Following the imaging study,  I've recommended a cystoscopy. I described how this is performed, typically in an office setting with a flexible cystoscope. We described the risks,  benefits, and possible side effects, the most common of which is a minor amount of blood in the urine and/or burning which usually resolves in 24 to 48 hours.   The patient had the opportunity to ask questions which were answered. Based upon this discussion, the patient is willing to proceed. Therefore, I've ordered: a CT Urogram and cystoscopy. The patient will return following all of the above for discussion of the results.  UA with 11-30 RBC's Urine culture pending  2. Left renal stones Patient did not want to have any definitive treatment for his left renal stones at this time he would like to table the issue until the fall  3. Right renal stones Small - will continue to monitor   4. ED Offered intracavernosal injections but did advise him due to his anticoagulation status the injections may cause significant bruising in his penis -he declined any further treatment for his ED at this time   Return in about 6 months (around 10/06/2018) for CT Urogram report and cystoscopy.  These notes generated with voice recognition software. I apologize for typographical errors.  Zara Council, PA-C  [facility] [address]  [place], [place] [address] [phone]

## 2018-04-06 ENCOUNTER — Encounter: Payer: Self-pay | Admitting: Urology

## 2018-04-06 ENCOUNTER — Ambulatory Visit (INDEPENDENT_AMBULATORY_CARE_PROVIDER_SITE_OTHER): Payer: Medicare Other | Admitting: Urology

## 2018-04-06 VITALS — BP 99/64 | HR 99 | Ht 66.0 in | Wt 193.0 lb

## 2018-04-06 DIAGNOSIS — R3129 Other microscopic hematuria: Secondary | ICD-10-CM

## 2018-04-06 DIAGNOSIS — N529 Male erectile dysfunction, unspecified: Secondary | ICD-10-CM | POA: Diagnosis not present

## 2018-04-06 DIAGNOSIS — N200 Calculus of kidney: Secondary | ICD-10-CM | POA: Diagnosis not present

## 2018-04-06 LAB — MICROSCOPIC EXAMINATION

## 2018-04-06 LAB — URINALYSIS, COMPLETE
BILIRUBIN UA: NEGATIVE
Ketones, UA: NEGATIVE
LEUKOCYTES UA: NEGATIVE
Nitrite, UA: NEGATIVE
Specific Gravity, UA: 1.02 (ref 1.005–1.030)
Urobilinogen, Ur: 0.2 mg/dL (ref 0.2–1.0)
pH, UA: 6.5 (ref 5.0–7.5)

## 2018-04-09 LAB — CULTURE, URINE COMPREHENSIVE

## 2018-04-13 ENCOUNTER — Encounter: Payer: Self-pay | Admitting: Urology

## 2018-04-13 ENCOUNTER — Ambulatory Visit
Admission: RE | Admit: 2018-04-13 | Discharge: 2018-04-13 | Disposition: A | Discharge status: Home / Self Care | Payer: Medicare Other | Source: Ambulatory Visit | Attending: Urology | Admitting: Urology

## 2018-04-13 ENCOUNTER — Ambulatory Visit (INDEPENDENT_AMBULATORY_CARE_PROVIDER_SITE_OTHER): Payer: Medicare Other | Admitting: Urology

## 2018-04-13 VITALS — BP 91/63 | HR 97 | Ht 66.0 in | Wt 194.4 lb

## 2018-04-13 DIAGNOSIS — Z96643 Presence of artificial hip joint, bilateral: Secondary | ICD-10-CM | POA: Diagnosis not present

## 2018-04-13 DIAGNOSIS — N200 Calculus of kidney: Secondary | ICD-10-CM

## 2018-04-13 DIAGNOSIS — R3129 Other microscopic hematuria: Secondary | ICD-10-CM

## 2018-04-13 DIAGNOSIS — N329 Bladder disorder, unspecified: Secondary | ICD-10-CM

## 2018-04-13 LAB — URINALYSIS, COMPLETE
BILIRUBIN UA: NEGATIVE
GLUCOSE, UA: NEGATIVE
KETONES UA: NEGATIVE
Leukocytes, UA: NEGATIVE
NITRITE UA: NEGATIVE
Protein, UA: NEGATIVE
Urobilinogen, Ur: 0.2 mg/dL (ref 0.2–1.0)
pH, UA: 5 (ref 5.0–7.5)

## 2018-04-13 LAB — MICROSCOPIC EXAMINATION

## 2018-04-13 LAB — POCT I-STAT CREATININE: CREATININE: 0.9 mg/dL (ref 0.61–1.24)

## 2018-04-13 MED ORDER — LIDOCAINE HCL URETHRAL/MUCOSAL 2 % EX GEL
1.0000 "application " | Freq: Once | CUTANEOUS | Status: AC
  Administered 2018-04-13: 1 via URETHRAL

## 2018-04-13 MED ORDER — IOPAMIDOL (ISOVUE-300) INJECTION 61%
125.0000 mL | Freq: Once | INTRAVENOUS | Status: AC | PRN
  Administered 2018-04-13: 125 mL via INTRAVENOUS

## 2018-04-13 MED ORDER — CIPROFLOXACIN HCL 500 MG PO TABS
500.0000 mg | ORAL_TABLET | Freq: Once | ORAL | Status: AC
  Administered 2018-04-13: 500 mg via ORAL

## 2018-04-13 NOTE — Progress Notes (Signed)
   04/13/18  CC:  Chief Complaint  Patient presents with  . Cysto    HPI: 62 year old male who presents today for cystoscopy for further evaluation of microscopic hematuria.  He underwent CT urogram earlier today which showed bilateral nonobstructing stones, largest measuring 9 mm on the left.  Otherwise, his urogram was unremarkable.  His bladder was poorly visualized due to artifact from his bilateral hips.  No filling defects appreciated although there was some incomplete filling of the left mid ureter secondary to contrast bolus timing.  He does note today that he has been having severe issues with his GI tract including copious amounts of diarrhea, upset stomach, and fecal incontinence.  This is been going on for about a month and he has been miserable.  He said several scans and is under the care of gastroenterology.  Blood pressure 91/63, pulse 97, height 5\' 6"  (1.676 m), weight 194 lb 6.4 oz (88.2 kg). NED. A&Ox3.   No respiratory distress   Abd soft, NT, ND Normal phallus with bilateral descended testicles  Cystoscopy Procedure Note  Patient identification was confirmed, informed consent was obtained, and patient was prepped using Betadine solution.  Lidocaine jelly was administered per urethral meatus.    Preoperative abx where received prior to procedure.     Pre-Procedure: - Inspection reveals a normal caliber ureteral meatus.  Procedure: The flexible cystoscope was introduced without difficulty - No urethral strictures/lesions are present. - Normal prostate  - Normal bladder neck - Bilateral ureteral orifices identified - Bladder mucosa  reveals a nonspecific cobblestone patch-like area ~2 cm on the right lateral wall of the bladder of unclear etiology, not particularly concerning for underlying malignancy.  No obvious fistulous tract appreciated. - No bladder stones - No trabeculation  Retroflexion shows unremarkable   Post-Procedure: - Patient tolerated  the procedure well  Assessment/ Plan:  1. Lesion of bladder Unclear irregular area on the right lateral wall of the bladder ?  Focal inflammation/cystitis versus less likely underlying malignancy We discussed options including proceeding to the operating room immediately for biopsy versus repeating the cystoscopy in the office in 4 to 6 weeks for assessment for resolution Urine cytology sent today Given very low concern for underlying malignancy, he is opted to pursue repeat cystoscopy in 6 weeks for reassessment If this area persists, will proceed to the operating room for biopsy He is agreeable this plan and understands the importance of follow-up as discussed  2. Microscopic hematuria As above - Urinalysis, Complete - ciprofloxacin (CIPRO) tablet 500 mg - lidocaine (XYLOCAINE) 2 % jelly 1 application  3. Kidney stones Incidental nonobstructing kidney stones Currently asymptomatic Treatment not discussed today, will discuss treatment in the future pending the above   Return in about 6 weeks (around 05/25/2018) for cysto.  Hollice Espy, MD

## 2018-04-21 ENCOUNTER — Other Ambulatory Visit: Payer: Self-pay | Admitting: Urology

## 2018-05-03 ENCOUNTER — Telehealth: Payer: Self-pay

## 2018-05-03 NOTE — Telephone Encounter (Signed)
At this late date, the OR schedule is completely booked tomorrow and it will not be possible.  Also, we will plan to look back in his bladder again in a few weeks to see if this area had resolved rather than going right to biopsy.  He may not need a biopsy at all.  We will discuss this further at his visit.

## 2018-05-03 NOTE — Telephone Encounter (Signed)
Pt is off coumadin for his endoscopy and colonoscopy happening tomorrow 05/04/18. Pt would like to know if it is possible to go ahead and proceed to operating room to address all of his issues while he is already off his coumadin. He states he does not want to keep starting and stopping this medication. He wants you to go ahead and address his biopsy and his kidney stones. Please advise.

## 2018-05-04 ENCOUNTER — Ambulatory Visit: Payer: Medicare Other | Admitting: Anesthesiology

## 2018-05-04 ENCOUNTER — Encounter
Admission: RE | Disposition: A | Discharge status: Home / Self Care | Payer: Self-pay | Source: Ambulatory Visit | Attending: Gastroenterology

## 2018-05-04 ENCOUNTER — Ambulatory Visit
Admission: RE | Admit: 2018-05-04 | Discharge: 2018-05-04 | Disposition: A | Discharge status: Home / Self Care | Payer: Medicare Other | Source: Ambulatory Visit | Attending: Gastroenterology | Admitting: Gastroenterology

## 2018-05-04 DIAGNOSIS — E1151 Type 2 diabetes mellitus with diabetic peripheral angiopathy without gangrene: Secondary | ICD-10-CM | POA: Diagnosis not present

## 2018-05-04 DIAGNOSIS — Z79891 Long term (current) use of opiate analgesic: Secondary | ICD-10-CM | POA: Diagnosis not present

## 2018-05-04 DIAGNOSIS — Z7982 Long term (current) use of aspirin: Secondary | ICD-10-CM | POA: Insufficient documentation

## 2018-05-04 DIAGNOSIS — R1013 Epigastric pain: Secondary | ICD-10-CM | POA: Insufficient documentation

## 2018-05-04 DIAGNOSIS — K2950 Unspecified chronic gastritis without bleeding: Secondary | ICD-10-CM | POA: Insufficient documentation

## 2018-05-04 DIAGNOSIS — R197 Diarrhea, unspecified: Secondary | ICD-10-CM | POA: Insufficient documentation

## 2018-05-04 DIAGNOSIS — K3189 Other diseases of stomach and duodenum: Secondary | ICD-10-CM | POA: Insufficient documentation

## 2018-05-04 DIAGNOSIS — R1033 Periumbilical pain: Secondary | ICD-10-CM | POA: Diagnosis not present

## 2018-05-04 DIAGNOSIS — R112 Nausea with vomiting, unspecified: Secondary | ICD-10-CM | POA: Insufficient documentation

## 2018-05-04 DIAGNOSIS — Z79899 Other long term (current) drug therapy: Secondary | ICD-10-CM | POA: Diagnosis not present

## 2018-05-04 DIAGNOSIS — R1084 Generalized abdominal pain: Secondary | ICD-10-CM | POA: Insufficient documentation

## 2018-05-04 DIAGNOSIS — D122 Benign neoplasm of ascending colon: Secondary | ICD-10-CM | POA: Diagnosis not present

## 2018-05-04 DIAGNOSIS — I110 Hypertensive heart disease with heart failure: Secondary | ICD-10-CM | POA: Insufficient documentation

## 2018-05-04 DIAGNOSIS — Z7901 Long term (current) use of anticoagulants: Secondary | ICD-10-CM | POA: Diagnosis not present

## 2018-05-04 DIAGNOSIS — K559 Vascular disorder of intestine, unspecified: Secondary | ICD-10-CM | POA: Insufficient documentation

## 2018-05-04 DIAGNOSIS — Z955 Presence of coronary angioplasty implant and graft: Secondary | ICD-10-CM | POA: Diagnosis not present

## 2018-05-04 DIAGNOSIS — Z791 Long term (current) use of non-steroidal anti-inflammatories (NSAID): Secondary | ICD-10-CM | POA: Diagnosis not present

## 2018-05-04 DIAGNOSIS — Z6828 Body mass index (BMI) 28.0-28.9, adult: Secondary | ICD-10-CM | POA: Insufficient documentation

## 2018-05-04 DIAGNOSIS — K210 Gastro-esophageal reflux disease with esophagitis: Secondary | ICD-10-CM | POA: Insufficient documentation

## 2018-05-04 DIAGNOSIS — Z7984 Long term (current) use of oral hypoglycemic drugs: Secondary | ICD-10-CM | POA: Diagnosis not present

## 2018-05-04 DIAGNOSIS — Z87891 Personal history of nicotine dependence: Secondary | ICD-10-CM | POA: Diagnosis not present

## 2018-05-04 DIAGNOSIS — E669 Obesity, unspecified: Secondary | ICD-10-CM | POA: Insufficient documentation

## 2018-05-04 DIAGNOSIS — I509 Heart failure, unspecified: Secondary | ICD-10-CM | POA: Insufficient documentation

## 2018-05-04 HISTORY — PX: COLONOSCOPY WITH PROPOFOL: SHX5780

## 2018-05-04 HISTORY — PX: ESOPHAGOGASTRODUODENOSCOPY (EGD) WITH PROPOFOL: SHX5813

## 2018-05-04 LAB — PROTIME-INR
INR: 1.29
Prothrombin Time: 16 seconds — ABNORMAL HIGH (ref 11.4–15.2)

## 2018-05-04 SURGERY — COLONOSCOPY WITH PROPOFOL
Anesthesia: General

## 2018-05-04 MED ORDER — GLYCOPYRROLATE 0.2 MG/ML IJ SOLN
INTRAMUSCULAR | Status: AC
  Filled 2018-05-04: qty 1

## 2018-05-04 MED ORDER — PROPOFOL 500 MG/50ML IV EMUL
INTRAVENOUS | Status: AC
  Filled 2018-05-04: qty 50

## 2018-05-04 MED ORDER — PHENYLEPHRINE HCL 10 MG/ML IJ SOLN
INTRAMUSCULAR | Status: DC | PRN
  Administered 2018-05-04 (×4): 100 ug via INTRAVENOUS

## 2018-05-04 MED ORDER — MIDAZOLAM HCL 2 MG/2ML IJ SOLN
INTRAMUSCULAR | Status: AC
  Filled 2018-05-04: qty 2

## 2018-05-04 MED ORDER — PROPOFOL 10 MG/ML IV BOLUS
INTRAVENOUS | Status: DC | PRN
  Administered 2018-05-04: 50 mg via INTRAVENOUS

## 2018-05-04 MED ORDER — CLINDAMYCIN PHOSPHATE 600 MG/50ML IV SOLN
600.0000 mg | Freq: Once | INTRAVENOUS | Status: AC
  Administered 2018-05-04: 600 mg via INTRAVENOUS

## 2018-05-04 MED ORDER — FENTANYL CITRATE (PF) 100 MCG/2ML IJ SOLN
INTRAMUSCULAR | Status: AC
  Filled 2018-05-04: qty 2

## 2018-05-04 MED ORDER — PROPOFOL 500 MG/50ML IV EMUL
INTRAVENOUS | Status: DC | PRN
  Administered 2018-05-04: 100 ug/kg/min via INTRAVENOUS

## 2018-05-04 MED ORDER — SODIUM CHLORIDE 0.9 % IV SOLN
INTRAVENOUS | Status: DC
  Administered 2018-05-04: 11:00:00 via INTRAVENOUS

## 2018-05-04 MED ORDER — FENTANYL CITRATE (PF) 100 MCG/2ML IJ SOLN
INTRAMUSCULAR | Status: DC | PRN
  Administered 2018-05-04: 100 ug via INTRAVENOUS

## 2018-05-04 MED ORDER — EPHEDRINE SULFATE 50 MG/ML IJ SOLN
INTRAMUSCULAR | Status: DC | PRN
  Administered 2018-05-04: 10 mg via INTRAVENOUS

## 2018-05-04 MED ORDER — MIDAZOLAM HCL 2 MG/2ML IJ SOLN
INTRAMUSCULAR | Status: DC | PRN
  Administered 2018-05-04: 2 mg via INTRAVENOUS

## 2018-05-04 MED ORDER — CLINDAMYCIN PHOSPHATE 600 MG/50ML IV SOLN
INTRAVENOUS | Status: AC
  Filled 2018-05-04: qty 50

## 2018-05-04 MED ORDER — GLYCOPYRROLATE 0.2 MG/ML IJ SOLN
INTRAMUSCULAR | Status: DC | PRN
  Administered 2018-05-04: 0.2 mg via INTRAVENOUS

## 2018-05-04 NOTE — Anesthesia Preprocedure Evaluation (Addendum)
Anesthesia Evaluation  Patient identified by MRN, date of birth, ID band Patient awake    Reviewed: Allergy & Precautions, NPO status , Patient's Chart, lab work & pertinent test results, reviewed documented beta blocker date and time   Airway Mallampati: III  TM Distance: >3 FB     Dental  (+) Chipped   Pulmonary former smoker,           Cardiovascular hypertension, Pt. on medications and Pt. on home beta blockers + Cardiac Stents, + Peripheral Vascular Disease and +CHF       Neuro/Psych CVA    GI/Hepatic   Endo/Other  diabetes, Type 2  Renal/GU      Musculoskeletal  (+) Arthritis ,   Abdominal   Peds  Hematology   Anesthesia Other Findings Obese. AV replace.  Reproductive/Obstetrics                             Anesthesia Physical Anesthesia Plan  ASA: III  Anesthesia Plan: General   Post-op Pain Management:    Induction: Intravenous  PONV Risk Score and Plan:   Airway Management Planned:   Additional Equipment:   Intra-op Plan:   Post-operative Plan:   Informed Consent: I have reviewed the patients History and Physical, chart, labs and discussed the procedure including the risks, benefits and alternatives for the proposed anesthesia with the patient or authorized representative who has indicated his/her understanding and acceptance.     Plan Discussed with: CRNA  Anesthesia Plan Comments:         Anesthesia Quick Evaluation

## 2018-05-04 NOTE — Transfer of Care (Signed)
Immediate Anesthesia Transfer of Care Note  Patient: Michael Patrick  Procedure(s) Performed: COLONOSCOPY WITH PROPOFOL (N/A ) ESOPHAGOGASTRODUODENOSCOPY (EGD) WITH PROPOFOL (N/A )  Patient Location: PACU  Anesthesia Type:General  Level of Consciousness: awake  Airway & Oxygen Therapy: Patient Spontanous Breathing and Patient connected to nasal cannula oxygen  Post-op Assessment: Report given to RN and Post -op Vital signs reviewed and stable  Post vital signs: Reviewed and stable  Last Vitals:  Vitals Value Taken Time  BP 104/51 05/04/2018  1:00 PM  Temp 35.8 C 05/04/2018  1:00 PM  Pulse 71 05/04/2018  1:03 PM  Resp 14 05/04/2018  1:03 PM  SpO2 97 % 05/04/2018  1:03 PM  Vitals shown include unvalidated device data.  Last Pain:  Vitals:   05/04/18 1031  TempSrc: Tympanic         Complications: No apparent anesthesia complications

## 2018-05-04 NOTE — Anesthesia Post-op Follow-up Note (Signed)
Anesthesia QCDR form completed.        

## 2018-05-04 NOTE — Op Note (Signed)
[facility]astroenterology Patient Name: Michael Patrick Procedure Date: 05/04/2018 11:18 AM MRN: [id number] Account #: [id number] Date of Birth: [birth year] Admit Type: Outpatient Age: 62 Room: [facility] ENDO ROOM 3 Gender: Male Note Status: Finalized Procedure:            Upper GI endoscopy Indications:          Periumbilical abdominal pain, Generalized abdominal                        pain, Diarrhea, Nausea with vomiting Providers:            Lollie Sails, MD Referring MD:         Rusty Aus, MD (Referring MD) Medicines:            Monitored Anesthesia Care Complications:        No immediate complications. Procedure:            Pre-Anesthesia Assessment:                       - ASA Grade Assessment: III - A patient with severe                        systemic disease.                       After obtaining informed consent, the endoscope was                        passed under direct vision. Throughout the procedure,                        the patient's blood pressure, pulse, and oxygen                        saturations were monitored continuously. The Endoscope                        was introduced through the mouth, and advanced to the                        fourth part of duodenum. The upper GI endoscopy was                        accomplished without difficulty. The patient tolerated                        the procedure well. Findings:      LA Grade A (one or more mucosal breaks less than 5 mm, not extending       between tops of 2 mucosal folds) esophagitis with no bleeding was found.       Biopsies were taken with a cold forceps for histology.      The exam of the esophagus was otherwise normal.      Localized and patchy moderate inflammation characterized by congestion       (edema) and erythema was found in the uppergastric body and in the       posterior gastric antrum. Biopsies were taken with a cold forceps for       histology.      Diffuse  mild inflammation characterized by congestion (edema) was found  in the gastric body and in the gastric antrum. Biopsies were taken with       a cold forceps for histology.      The cardia and gastric fundus were normal on retroflexion otherwise.      Patchy mild mucosal variance characterized by variable/altered texture       was found in the entire duodenum. Biopsies were taken with a cold       forceps for histology. Impression:           - LA Grade A erosive esophagitis. Biopsied.                       - Possible medhanical/irritative gastritis. Biopsied.                       - Gastritis. Biopsied.                       - Mucosal variant in the duodenum. Biopsied. Recommendation:       - Use Protonix (pantoprazole) 40 mg PO BID daily.                       - Use sucralfate tablets 1 gram PO QID for 1 month.                       - Await pathology results. Procedure Code(s):    --- Professional ---                       [phone], Esophagogastroduodenoscopy, flexible, transoral;                        with biopsy, single or multiple Diagnosis Code(s):    --- Professional ---                       K20.8, Other esophagitis                       K29.70, Gastritis, unspecified, without bleeding                       K31.89, Other diseases of stomach and duodenum                       U04.54, Periumbilical pain                       R10.84, Generalized abdominal pain                       R19.7, Diarrhea, unspecified                       R11.2, Nausea with vomiting, unspecified CPT copyright 2017 American Medical Association. All rights reserved. The codes documented in this report are preliminary and upon coder review may  be revised to meet current compliance requirements. Lollie Sails, MD 05/04/2018 12:18:39 PM This report has been signed electronically. Number of Addenda: 0 Note Initiated On: 05/04/2018 11:18 AM      [facility]

## 2018-05-04 NOTE — Telephone Encounter (Signed)
Informed patient's wife of Dr Cherrie Gauze note below and that cystoscopy should be performed prior to scheduling a biopsy. Questions answered. Wife voices understanding.

## 2018-05-04 NOTE — H&P (Signed)
Outpatient short stay form Pre-procedure 05/04/2018 11:42 AM Michael Sails MD  Primary Physician: Emily Filbert, MD  Reason for visit: EGD and colonoscopy  History of present illness: Patient is a 62 year old male presenting today as above.  He has a history of generalized abdominal pain mostly centrally.  He also has a history of dyspepsia.  He had an episode of diarrhea following some bad food started about 3 months ago.  Had intermittent nausea then.  Been some crampiness abdominal discomfort and mostly diarrhea.  He has been very foul-smelling burps.  He is never had a colonoscopy or upper scope.  Tolerated his prep well.  He does take Coumadin this was bridged with Lovenox.  His INR this morning was 1.29.  His last dose of Lovenox was last night.  Seen no blood in the stool.  Patient has multiple medical issues as noted below.    Current Facility-Administered Medications:  .  0.9 %  sodium chloride infusion, , Intravenous, Continuous, Michael Sails, MD, Last Rate: 20 mL/hr at 05/04/18 1115 .  clindamycin (CLEOCIN) 600 MG/50ML IVPB, , , ,  .  clindamycin (CLEOCIN) IVPB 600 mg, 600 mg, Intravenous, Once, Michael Sails, MD, Last Rate: 100 mL/hr at 05/04/18 1115, 600 mg at 05/04/18 1115  Medications Prior to Admission  Medication Sig Dispense Refill Last Dose  . aspirin EC 81 MG tablet Take by mouth.   05/03/2018 at Unknown time  . carvedilol (COREG) 12.5 MG tablet    05/04/2018 at Unknown time  . enoxaparin (LOVENOX) 40 MG/0.4ML injection Inject 40 mg into the skin daily.   05/03/2018 at Unknown time  . finasteride (PROSCAR) 5 MG tablet TAKE ONE TABLET BY MOUTH EVERY DAY   05/04/2018 at Unknown time  . furosemide (LASIX) 40 MG tablet Take by mouth.   05/03/2018 at Unknown time  . gabapentin (NEURONTIN) 800 MG tablet Take by mouth.   05/04/2018 at Unknown time  . lisinopril (PRINIVIL,ZESTRIL) 40 MG tablet TAKE ONE TABLET EVERY DAY   05/04/2018 at Unknown time  .  oxyCODONE-acetaminophen (PERCOCET/ROXICET) 5-325 MG tablet    05/04/2018 at Unknown time  . spironolactone (ALDACTONE) 25 MG tablet    05/04/2018 at Unknown time  . celecoxib (CELEBREX) 200 MG capsule TAKE ONE TABLET BY MOUTH EVERY DAY   Taking  . Cinnamon Bark POWD by Does not apply route.   Taking  . clopidogrel (PLAVIX) 75 MG tablet TAKE ONE TABLET BY MOUTH EVERY MORNING   Not Taking at Unknown time  . Cyanocobalamin (VITAMIN B-12) 5000 MCG SUBL Place under the tongue.   Taking  . diazepam (VALIUM) 5 MG tablet Take by mouth.   Taking  . Dulaglutide 0.75 MG/0.5ML SOPN Inject into the skin.   Taking  . Exenatide ER (BYDUREON) 2 MG PEN Inject into the skin.   Taking  . glimepiride (AMARYL) 4 MG tablet Take by mouth.   Taking  . magnesium oxide (MAG-OX) 400 MG tablet Take 400 mg by mouth daily.   Taking  . metFORMIN (GLUCOPHAGE-XR) 500 MG 24 hr tablet 4 TABLETS BY MOUTH AT BEDTIME   Taking  . niacin 250 MG tablet Take by mouth.   Taking  . nitroGLYCERIN (NITROSTAT) 0.4 MG SL tablet PLACE 1 TABLET UNDER TONGUE EVERY 5 MIN AS NEEDED FOR CHEST PAIN IF NO RELIEF IN15 MIN CALL 911 (MAX 3 TABS)   Taking  . ondansetron (ZOFRAN) 4 MG tablet Take by mouth.   Taking  . OXYCONTIN 20 MG 12  hr tablet    Taking  . pantoprazole (PROTONIX) 40 MG tablet Take 40 mg by mouth daily.  2 Taking  . Polyethyl Glycol-Propyl Glycol 0.4-0.3 % SOLN Apply to eye.   Taking  . promethazine (PHENERGAN) 25 MG tablet Take by mouth.   Taking  . TIKOSYN 500 MCG capsule    Taking  . topiramate (TOPAMAX) 25 MG tablet TAKE 3 TABLETS (75 MG TOTAL) BY MOUTH 2 (TWO) TIMES DAILY  1 Taking  . warfarin (COUMADIN) 1 MG tablet TAKE ONE TABLET AT BEDTIME   Taking  . warfarin (COUMADIN) 6 MG tablet    04/29/2018     No Active Allergies   Past Medical History:  Diagnosis Date  . Carotid artery occlusion   . CHF (congestive heart failure) ([place])   . Diabetes mellitus without complication ([place])   . Hyperlipidemia   . Hypertension   .  Stroke [facility])     Review of systems:      Physical Exam    Heart and lungs: Regular rate and rhythm without rub or gallop lungs are bilaterally clear.  Mechanical noise valve prominent    HEENT: Normocephalic atraumatic eyes are anicteric    Other:    Pertinant exam for procedure: Soft nontender nondistended bowel sounds positive normoactive    Planned proceedures: EGD, colonoscopy and indicated procedures. I have discussed the risks benefits and complications of procedures to include not limited to bleeding, infection, perforation and the risk of sedation and the patient wishes to proceed.    Michael Sails, MD Gastroenterology 05/04/2018  11:42 AM

## 2018-05-04 NOTE — Progress Notes (Signed)
Patient and wife very angry because "you are running behind and you should have told me" . Patient had already

## 2018-05-04 NOTE — Op Note (Signed)
[facility] Gastroenterology Patient Name: Michael Patrick Procedure Date: 05/04/2018 11:17 AM MRN: [id number] Account #: [id number] Date of Birth: [birth year] Admit Type: Outpatient Age: 62 Room: [facility] ENDO ROOM 3 Gender: Male Note Status: Finalized Procedure:            Colonoscopy Indications:          Epigastric abdominal pain, Generalized abdominal pain,                        Clinically significant diarrhea of unexplained origin Providers:            Lollie Sails, MD Medicines:            Monitored Anesthesia Care Complications:        No immediate complications. Procedure:            Pre-Anesthesia Assessment:                       - ASA Grade Assessment: III - A patient with severe                        systemic disease.                       After obtaining informed consent, the colonoscope was                        passed under direct vision. Throughout the procedure,                        the patient's blood pressure, pulse, and oxygen                        saturations were monitored continuously. The                        Colonoscope was introduced through the anus and                        advanced to the the terminal ileum. The colonoscopy was                        performed with moderate difficulty due to poor bowel                        prep. Successful completion of the procedure was aided                        by lavage. The patient tolerated the procedure well.                        The quality of the bowel preparation was fair. Findings:      Patchy moderate inflammation characterized by congestion (edema),       erosions, erythema and shallow ulcerations was found in the proximal       ascending colon, in the cecum and appendiceal orifice. Biopsies were       taken with a cold forceps for histology.      The terminal ileum appeared normal. Biopsies were taken with a cold       forceps for histology.  The exam was otherwise  normal throughout the examined colon.      Biopsies for histology were taken with a cold forceps from the ascending       colon, transverse colon, descending colon, sigmoid colon and rectum for       evaluation of microscopic colitis.      A 2 mm polyp was found in the ascending colon. The polyp was sessile.       The polyp was removed with a cold biopsy forceps. Resection and       retrieval were complete.      The retroflexed view of the distal rectum and anal verge was normal and       showed no anal or rectal abnormalities.      The digital rectal exam was normal. Impression:           - Preparation of the colon was fair.                       - Patchy moderate inflammation was found in the                        proximal ascending colon, in the cecum and at the                        appendiceal orifice secondary to ischemic colitis.                        Biopsied.                       - The examined portion of the ileum was normal.                        Biopsied.                       - One 2 mm polyp in the ascending colon, removed with a                        cold biopsy forceps. Resected and retrieved.                       - The distal rectum and anal verge are normal on                        retroflexion view.                       - Biopsies were taken with a cold forceps from the                        ascending colon, transverse colon, descending colon,                        sigmoid colon and rectum for evaluation of microscopic                        colitis. Recommendation:       - Discharge patient to home.                       - Low residue  diet.                       - Return to GI clinic in 10 days. Procedure Code(s):    --- Professional ---                       [phone], Colonoscopy, flexible; with biopsy, single or                        multiple Diagnosis Code(s):    --- Professional ---                       K55.9, Vascular disorder of intestine,  unspecified                       D12.2, Benign neoplasm of ascending colon                       R10.13, Epigastric pain                       R10.84, Generalized abdominal pain                       R19.7, Diarrhea, unspecified CPT copyright 2017 American Medical Association. All rights reserved. The codes documented in this report are preliminary and upon coder review may  be revised to meet current compliance requirements. Lollie Sails, MD 05/04/2018 1:01:36 PM This report has been signed electronically. Number of Addenda: 0 Note Initiated On: 05/04/2018 11:17 AM Scope Withdrawal Time: 0 hours 17 minutes 42 seconds  Total Procedure Duration: 0 hours 31 minutes 29 seconds       [facility]

## 2018-05-06 ENCOUNTER — Encounter: Payer: Self-pay | Admitting: Gastroenterology

## 2018-05-06 LAB — SURGICAL PATHOLOGY

## 2018-05-06 NOTE — Anesthesia Postprocedure Evaluation (Signed)
Anesthesia Post Note  Patient: ISHAAN VILLAMAR  Procedure(s) Performed: COLONOSCOPY WITH PROPOFOL (N/A ) ESOPHAGOGASTRODUODENOSCOPY (EGD) WITH PROPOFOL (N/A )  Patient location during evaluation: PACU Anesthesia Type: General Level of consciousness: awake and alert Pain management: pain level controlled Vital Signs Assessment: post-procedure vital signs reviewed and stable Respiratory status: spontaneous breathing, nonlabored ventilation, respiratory function stable and patient connected to nasal cannula oxygen Cardiovascular status: blood pressure returned to baseline and stable Postop Assessment: no apparent nausea or vomiting Anesthetic complications: no     Last Vitals:  Vitals:   05/04/18 1319 05/04/18 1320  BP: 125/81   Pulse: 69 70  Resp: 16 17  Temp:    SpO2: 98% 98%    Last Pain:  Vitals:   05/05/18 0734  TempSrc:   PainSc: 0-No pain                 Molli Barrows

## 2018-05-07 ENCOUNTER — Ambulatory Visit: Admission: RE | Admit: 2018-05-07 | Payer: Medicare Other | Source: Ambulatory Visit | Admitting: Gastroenterology

## 2018-05-07 ENCOUNTER — Encounter: Admission: RE | Payer: Self-pay | Source: Ambulatory Visit

## 2018-05-07 SURGERY — COLONOSCOPY WITH PROPOFOL
Anesthesia: General

## 2018-05-10 ENCOUNTER — Inpatient Hospital Stay
Admission: AD | Admit: 2018-05-10 | Discharge: 2018-05-12 | DRG: 357 | Disposition: A | Discharge status: Home / Self Care | Payer: Medicare Other | Source: Ambulatory Visit | Attending: Internal Medicine | Admitting: Internal Medicine

## 2018-05-10 DIAGNOSIS — Z79899 Other long term (current) drug therapy: Secondary | ICD-10-CM

## 2018-05-10 DIAGNOSIS — I6529 Occlusion and stenosis of unspecified carotid artery: Secondary | ICD-10-CM | POA: Diagnosis present

## 2018-05-10 DIAGNOSIS — Z66 Do not resuscitate: Secondary | ICD-10-CM | POA: Diagnosis present

## 2018-05-10 DIAGNOSIS — F17200 Nicotine dependence, unspecified, uncomplicated: Secondary | ICD-10-CM | POA: Diagnosis present

## 2018-05-10 DIAGNOSIS — Z7984 Long term (current) use of oral hypoglycemic drugs: Secondary | ICD-10-CM | POA: Diagnosis not present

## 2018-05-10 DIAGNOSIS — Z952 Presence of prosthetic heart valve: Secondary | ICD-10-CM

## 2018-05-10 DIAGNOSIS — I4891 Unspecified atrial fibrillation: Secondary | ICD-10-CM | POA: Diagnosis present

## 2018-05-10 DIAGNOSIS — Z8673 Personal history of transient ischemic attack (TIA), and cerebral infarction without residual deficits: Secondary | ICD-10-CM | POA: Diagnosis not present

## 2018-05-10 DIAGNOSIS — Z7982 Long term (current) use of aspirin: Secondary | ICD-10-CM

## 2018-05-10 DIAGNOSIS — R112 Nausea with vomiting, unspecified: Secondary | ICD-10-CM | POA: Diagnosis present

## 2018-05-10 DIAGNOSIS — I509 Heart failure, unspecified: Secondary | ICD-10-CM | POA: Diagnosis present

## 2018-05-10 DIAGNOSIS — T451X5A Adverse effect of antineoplastic and immunosuppressive drugs, initial encounter: Secondary | ICD-10-CM | POA: Diagnosis present

## 2018-05-10 DIAGNOSIS — K551 Chronic vascular disorders of intestine: Principal | ICD-10-CM | POA: Diagnosis present

## 2018-05-10 DIAGNOSIS — M545 Low back pain: Secondary | ICD-10-CM | POA: Diagnosis present

## 2018-05-10 DIAGNOSIS — E785 Hyperlipidemia, unspecified: Secondary | ICD-10-CM | POA: Diagnosis present

## 2018-05-10 DIAGNOSIS — Z955 Presence of coronary angioplasty implant and graft: Secondary | ICD-10-CM

## 2018-05-10 DIAGNOSIS — Z8249 Family history of ischemic heart disease and other diseases of the circulatory system: Secondary | ICD-10-CM | POA: Diagnosis not present

## 2018-05-10 DIAGNOSIS — I2510 Atherosclerotic heart disease of native coronary artery without angina pectoris: Secondary | ICD-10-CM | POA: Diagnosis present

## 2018-05-10 DIAGNOSIS — E119 Type 2 diabetes mellitus without complications: Secondary | ICD-10-CM | POA: Diagnosis present

## 2018-05-10 DIAGNOSIS — I110 Hypertensive heart disease with heart failure: Secondary | ICD-10-CM | POA: Diagnosis present

## 2018-05-10 DIAGNOSIS — K521 Toxic gastroenteritis and colitis: Secondary | ICD-10-CM | POA: Diagnosis present

## 2018-05-10 DIAGNOSIS — G8929 Other chronic pain: Secondary | ICD-10-CM | POA: Diagnosis present

## 2018-05-10 DIAGNOSIS — Z7901 Long term (current) use of anticoagulants: Secondary | ICD-10-CM

## 2018-05-10 DIAGNOSIS — Z7902 Long term (current) use of antithrombotics/antiplatelets: Secondary | ICD-10-CM | POA: Diagnosis not present

## 2018-05-10 DIAGNOSIS — K559 Vascular disorder of intestine, unspecified: Secondary | ICD-10-CM | POA: Diagnosis present

## 2018-05-10 LAB — COMPREHENSIVE METABOLIC PANEL
ALT: 24 U/L (ref 0–44)
AST: 22 U/L (ref 15–41)
Albumin: 3.5 g/dL (ref 3.5–5.0)
Alkaline Phosphatase: 72 U/L (ref 38–126)
Anion gap: 6 (ref 5–15)
BUN: 32 mg/dL — AB (ref 8–23)
CO2: 29 mmol/L (ref 22–32)
Calcium: 8.6 mg/dL — ABNORMAL LOW (ref 8.9–10.3)
Chloride: 97 mmol/L — ABNORMAL LOW (ref 98–111)
Creatinine, Ser: 1.71 mg/dL — ABNORMAL HIGH (ref 0.61–1.24)
GFR calc Af Amer: 48 mL/min — ABNORMAL LOW (ref >60)
GFR, EST NON AFRICAN AMERICAN: 41 mL/min — AB (ref >60)
Glucose, Bld: 476 mg/dL — ABNORMAL HIGH (ref 70–99)
POTASSIUM: 4.6 mmol/L (ref 3.5–5.1)
SODIUM: 132 mmol/L — AB (ref 135–145)
Total Bilirubin: 0.4 mg/dL (ref 0.3–1.2)
Total Protein: 6.1 g/dL — ABNORMAL LOW (ref 6.5–8.1)

## 2018-05-10 LAB — PROTIME-INR
INR: 1.33
PROTHROMBIN TIME: 16.4 s — AB (ref 11.4–15.2)

## 2018-05-10 LAB — BASIC METABOLIC PANEL
ANION GAP: 9 (ref 5–15)
BUN: 32 mg/dL — ABNORMAL HIGH (ref 8–23)
CHLORIDE: 98 mmol/L (ref 98–111)
CO2: 26 mmol/L (ref 22–32)
CREATININE: 1.8 mg/dL — AB (ref 0.61–1.24)
Calcium: 8.7 mg/dL — ABNORMAL LOW (ref 8.9–10.3)
GFR calc non Af Amer: 39 mL/min — ABNORMAL LOW (ref >60)
GFR, EST AFRICAN AMERICAN: 45 mL/min — AB (ref >60)
Glucose, Bld: 523 mg/dL (ref 70–99)
POTASSIUM: 4.7 mmol/L (ref 3.5–5.1)
Sodium: 133 mmol/L — ABNORMAL LOW (ref 135–145)

## 2018-05-10 LAB — CBC
HCT: 29.2 % — ABNORMAL LOW (ref 40.0–52.0)
HEMOGLOBIN: 10.4 g/dL — AB (ref 13.0–18.0)
MCH: 31.8 pg (ref 26.0–34.0)
MCHC: 35.6 g/dL (ref 32.0–36.0)
MCV: 89.3 fL (ref 80.0–100.0)
PLATELETS: 263 10*3/uL (ref 150–440)
RBC: 3.27 MIL/uL — AB (ref 4.40–5.90)
RDW: 14.3 % (ref 11.5–14.5)
WBC: 9.3 10*3/uL (ref 3.8–10.6)

## 2018-05-10 LAB — HEMOGLOBIN: Hemoglobin: 10.6 g/dL — ABNORMAL LOW (ref 13.0–18.0)

## 2018-05-10 LAB — APTT: aPTT: 32 seconds (ref 24–36)

## 2018-05-10 MED ORDER — ONDANSETRON HCL 4 MG PO TABS: 4.0000 mg | ORAL_TABLET | Freq: Four times a day (QID) | ORAL | Status: DC | PRN

## 2018-05-10 MED ORDER — ONDANSETRON HCL 4 MG/2ML IJ SOLN: 4.0000 mg | Freq: Four times a day (QID) | INTRAMUSCULAR | Status: DC | PRN

## 2018-05-10 MED ORDER — DIAZEPAM 5 MG PO TABS
5.0000 mg | ORAL_TABLET | Freq: Three times a day (TID) | ORAL | Status: DC | PRN
  Administered 2018-05-11: 5 mg via ORAL
  Filled 2018-05-10: qty 1

## 2018-05-10 MED ORDER — OXYCODONE HCL ER 20 MG PO T12A
20.0000 mg | EXTENDED_RELEASE_TABLET | Freq: Two times a day (BID) | ORAL | Status: DC
  Administered 2018-05-10 – 2018-05-12 (×4): 20 mg via ORAL
  Filled 2018-05-10 (×4): qty 1

## 2018-05-10 MED ORDER — MAGNESIUM OXIDE 400 (241.3 MG) MG PO TABS
200.0000 mg | ORAL_TABLET | Freq: Two times a day (BID) | ORAL | Status: DC
  Administered 2018-05-10 – 2018-05-12 (×4): 200 mg via ORAL
  Filled 2018-05-10 (×4): qty 1

## 2018-05-10 MED ORDER — PANTOPRAZOLE SODIUM 40 MG PO TBEC: 40.0000 mg | DELAYED_RELEASE_TABLET | Freq: Every day | ORAL | Status: DC

## 2018-05-10 MED ORDER — ACETAMINOPHEN 325 MG PO TABS: 650.0000 mg | ORAL_TABLET | Freq: Four times a day (QID) | ORAL | Status: DC | PRN

## 2018-05-10 MED ORDER — CARVEDILOL 12.5 MG PO TABS: 12.5000 mg | ORAL_TABLET | Freq: Two times a day (BID) | ORAL | Status: DC

## 2018-05-10 MED ORDER — OXYCODONE-ACETAMINOPHEN 5-325 MG PO TABS
2.0000 | ORAL_TABLET | Freq: Three times a day (TID) | ORAL | Status: DC
Start: 2018-05-10 — End: 2018-05-11
  Administered 2018-05-10: 2 via ORAL
  Filled 2018-05-10: qty 2

## 2018-05-10 MED ORDER — MAGNESIUM OXIDE 400 (241.3 MG) MG PO TABS: 200.0000 mg | ORAL_TABLET | Freq: Two times a day (BID) | ORAL | Status: DC

## 2018-05-10 MED ORDER — GLIMEPIRIDE 4 MG PO TABS
4.0000 mg | ORAL_TABLET | Freq: Every day | ORAL | Status: DC
  Filled 2018-05-10: qty 1

## 2018-05-10 MED ORDER — TOPIRAMATE 25 MG PO TABS
75.0000 mg | ORAL_TABLET | Freq: Two times a day (BID) | ORAL | Status: DC
  Administered 2018-05-11: 75 mg via ORAL
  Filled 2018-05-10 (×5): qty 3

## 2018-05-10 MED ORDER — DOFETILIDE 250 MCG PO CAPS
250.0000 ug | ORAL_CAPSULE | Freq: Two times a day (BID) | ORAL | Status: DC
  Administered 2018-05-11 – 2018-05-12 (×4): 250 ug via ORAL
  Filled 2018-05-10 (×5): qty 1

## 2018-05-10 MED ORDER — HEPARIN (PORCINE) IN NACL 100-0.45 UNIT/ML-% IJ SOLN
1200.0000 [IU]/h | INTRAMUSCULAR | Status: DC
Start: 2018-05-10 — End: 2018-05-11
  Administered 2018-05-10 – 2018-05-11 (×2): 1200 [IU]/h via INTRAVENOUS
  Filled 2018-05-10: qty 250

## 2018-05-10 MED ORDER — CARVEDILOL 12.5 MG PO TABS
12.5000 mg | ORAL_TABLET | Freq: Two times a day (BID) | ORAL | Status: DC
  Administered 2018-05-10 – 2018-05-12 (×4): 12.5 mg via ORAL
  Filled 2018-05-10 (×4): qty 1

## 2018-05-10 MED ORDER — GLIMEPIRIDE 4 MG PO TABS
4.0000 mg | ORAL_TABLET | Freq: Two times a day (BID) | ORAL | Status: DC
  Filled 2018-05-10: qty 1

## 2018-05-10 MED ORDER — GABAPENTIN 600 MG PO TABS
600.0000 mg | ORAL_TABLET | Freq: Four times a day (QID) | ORAL | Status: DC
  Administered 2018-05-10 – 2018-05-12 (×4): 600 mg via ORAL
  Filled 2018-05-10 (×4): qty 1

## 2018-05-10 MED ORDER — MORPHINE SULFATE (PF) 2 MG/ML IV SOLN
2.0000 mg | INTRAVENOUS | Status: DC | PRN
  Administered 2018-05-10: 2 mg via INTRAVENOUS
  Filled 2018-05-10: qty 1

## 2018-05-10 MED ORDER — PANTOPRAZOLE SODIUM 40 MG PO TBEC
40.0000 mg | DELAYED_RELEASE_TABLET | Freq: Two times a day (BID) | ORAL | Status: DC
  Administered 2018-05-10 – 2018-05-12 (×4): 40 mg via ORAL
  Filled 2018-05-10 (×4): qty 1

## 2018-05-10 MED ORDER — ACETAMINOPHEN 650 MG RE SUPP: 650.0000 mg | Freq: Four times a day (QID) | RECTAL | Status: DC | PRN

## 2018-05-10 MED ORDER — MAGNESIUM OXIDE 400 (241.3 MG) MG PO TABS: 400.0000 mg | ORAL_TABLET | Freq: Every day | ORAL | Status: DC

## 2018-05-10 MED ORDER — HEPARIN BOLUS VIA INFUSION
4000.0000 [IU] | Freq: Once | INTRAVENOUS | Status: AC
  Administered 2018-05-10: 4000 [IU] via INTRAVENOUS
  Filled 2018-05-10: qty 4000

## 2018-05-10 NOTE — Progress Notes (Signed)
   [place] [facility] Day: 0 days Michael Patrick is a 62 y.o. male presenting with No chief complaint on file. .   Advance care planning discussed with patient and his wife at bedside. All questions in regards to overall condition and expected prognosis answered. The decision was made to change his code status to DNR. Manuela Schwartz, RN witnessed conversation.  CODE STATUS: DNR Time spent: 18 minutes

## 2018-05-10 NOTE — Progress Notes (Signed)
ANTICOAGULATION CONSULT NOTE - Initial Consult  Pharmacy Consult for heparin Indication: AF with mechanical valve  No Active Allergies  Patient Measurements:   Heparin Dosing Weight: 83 kg  Vital Signs: Temp: 98.5 F (36.9 C) (07/29 1803) Temp Source: Oral (07/29 1803) BP: 100/59 (07/29 1803) Pulse Rate: 73 (07/29 1803)  Labs: No results for input(s): HGB, HCT, PLT, APTT, LABPROT, INR, HEPARINUNFRC, HEPRLOWMOCWT, CREATININE, CKTOTAL, CKMB, TROPONINI in the last 72 hours.  CrCl cannot be calculated (Patient's most recent lab result is older than the maximum 21 days allowed.).   Medical History: Past Medical History:  Diagnosis Date  . Carotid artery occlusion   . CHF (congestive heart failure) ([place])   . Diabetes mellitus without complication ([place])   . Hyperlipidemia   . Hypertension   . Stroke [facility])     Medications:  Infusions:  . heparin      Assessment: 61 yom cc ischemic colitis.Patient was referred from GI OP for still mostly bloody stools and stomach pain. PMH HTN, stroke, PVD, CHF, DM, HLD. Patient takes VKA PTA for AF. Earlier this month he had some GI work up that may include EGD and colonoscopy. He had been holding VKA due to supratherapeutic INR and blood in stool. Pharmacy consulted to dose heparin for AF with mechanical valve.   Goal of Therapy:  Heparin level 0.3-0.7 units/ml Monitor platelets by anticoagulation protocol: Yes   Plan:  Baseline labs and PTA list pending - will not start heparin until baseline labs are available due to GIB and recent supratherapeutic INR. Once labs return we will start heparin per protocol Give 4200 units bolus x 1 Start heparin infusion at 1200 units/hr Check anti-Xa level in 6 hours and daily while on heparin Continue to monitor H&H and platelets  20:33 Update - patient had CBC, CMP, and PT/INR at outpatient office today. INR 1.2. Patient is now refusing aPTT and HL (which we require since he had been bridged with  lovenox recently, last dose yesterday). Spoke with Dr. Tressia Miners - she will speak with nurse and try to get aPTT and HL. If not able she will call pharmacy back and update [place] on plan.   Laural Benes, Pharm.D., BCPS Clinical Pharmacist 05/10/2018,7:14 PM

## 2018-05-10 NOTE — Progress Notes (Signed)
Patient takes exenatide and dulaglitide at home, states he is sensitive to insulin and refuses to take that, he will need to bring his antiglycemic meds from home to verify dose and to be administered for his inpatient glucose control.  Jacqulyn Bath [facility] 05/10/2018, 10:39 PM

## 2018-05-10 NOTE — H&P (Signed)
[place] at [place] NAME: Michael Patrick    MR#:  [id number]  DATE OF BIRTH:  1956-09-21  DATE OF ADMISSION:  05/10/2018  PRIMARY CARE PHYSICIAN: Rusty Aus, MD   REQUESTING/REFERRING PHYSICIAN: Dr. Loistine Simas  CHIEF COMPLAINT:  No chief complaint on file.   HISTORY OF PRESENT ILLNESS:  Michael Patrick  is a 62 y.o. 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 Patient had symptoms of gastroenteritis about a month ago with nausea vomiting and diarrhea.  Seen GI at the time and was scheduled to get EGD and colonoscopy which were done last week.  Patient Coumadin was held and was bridged with Lovenox for the procedure.  His colonoscopy showed several ulcers at the ileocecal junction and also ascending colon consistent with ischemic colitis confirmed by biopsy.  After the procedure patient had 2 days of diarrhea followed by bright red blood clots and in the last couple of days he has been having dark stool whenever he was using the bathroom.  Hemoglobin did drop 2 points from 13 to 11 within the last week.  Patient still having generalized abdominal pain and so sent him by GI for angiogram.   PAST MEDICAL HISTORY:   Past Medical History:  Diagnosis Date  . Carotid artery occlusion   . CHF (congestive heart failure) ([place])   . Diabetes mellitus without complication ([place])   . Hyperlipidemia   . Hypertension   . Stroke [facility])     PAST SURGICAL HISTORY:   Past Surgical History:  Procedure Laterality Date  . AORTIC VALVE REPLACEMENT    . COLONOSCOPY WITH PROPOFOL N/A 05/04/2018   Procedure: COLONOSCOPY WITH PROPOFOL;  Surgeon: Lollie Sails,  MD;  Location: [facility] ENDOSCOPY;  Service: Endoscopy;  Laterality: N/A;  . CORONARY STENT PLACEMENT    . defibrillator/pacemaker     1992 Defib 4 yrs ago.Marland KitchenMarland KitchenMurmur/heart failure . during valve placement electrical component malfunction  . ESOPHAGOGASTRODUODENOSCOPY (EGD) WITH PROPOFOL N/A 05/04/2018   Procedure: ESOPHAGOGASTRODUODENOSCOPY (EGD) WITH PROPOFOL;  Surgeon: Lollie Sails, MD;  Location: [facility] ENDOSCOPY;  Service: Endoscopy;  Laterality: N/A;  . HIP SURGERY      SOCIAL HISTORY:   Social History   Tobacco Use  . Smoking status: Current Every Day Smoker  . Smokeless tobacco: Never Used  Substance Use Topics  . Alcohol use: Yes    Comment: occassionally    FAMILY HISTORY:   Family History  Problem Relation Age of Onset  . Hypertension Mother   . Hypertension Father     DRUG ALLERGIES:  No Active Allergies  REVIEW OF SYSTEMS:   Review of Systems  Constitutional: Negative for chills, fever, malaise/fatigue and weight loss.  HENT: Negative for ear discharge, ear pain, hearing loss and nosebleeds.   Eyes: Negative for blurred vision, double vision and photophobia.  Respiratory: Positive for shortness of breath. Negative for cough, hemoptysis and wheezing.   Cardiovascular: Negative for chest pain, palpitations, orthopnea and leg swelling.  Gastrointestinal: Positive for abdominal pain, melena and nausea. Negative for constipation, diarrhea, heartburn and vomiting.  Genitourinary: Negative for dysuria, frequency and urgency.  Musculoskeletal: Positive for back pain and myalgias. Negative for neck pain.  Skin: Negative for rash.  Neurological:  Negative for dizziness, tingling, tremors, sensory change, speech change, focal weakness and headaches.  Endo/Heme/Allergies: Does not bruise/bleed easily.  Psychiatric/Behavioral: Negative for depression.    MEDICATIONS AT HOME:   Prior to Admission medications   Medication Sig Start Date End Date Taking? Authorizing  Provider  aspirin EC 81 MG tablet Take 81 mg by mouth at bedtime.    Yes [provider]  diazepam (VALIUM) 5 MG tablet Take 5 mg by mouth 2 (two) times daily.  07/04/16  Yes [provider]  finasteride (PROSCAR) 5 MG tablet TAKE ONE TABLET BY MOUTH EVERY DAY 04/07/16  Yes [provider]  gabapentin (NEURONTIN) 800 MG tablet Take 600 mg by mouth 4 (four) times daily.  10/01/17 10/01/18 Yes [provider]  glimepiride (AMARYL) 4 MG tablet Take 4 mg by mouth 2 (two) times daily.  06/02/17 06/02/18 Yes [provider]  lisinopril (PRINIVIL,ZESTRIL) 40 MG tablet TAKE ONE TABLET EVERY DAY 04/07/16  Yes [provider]  magnesium oxide (MAG-OX) 400 MG tablet Take 400 mg by mouth daily. Takes 200 mg in AM and 200 mg at bedtime   Yes [provider]  carvedilol (COREG) 12.5 MG tablet  08/05/16   [provider]  celecoxib (CELEBREX) 200 MG capsule TAKE ONE TABLET BY MOUTH EVERY DAY 04/01/16   [provider]  Cinnamon Bark POWD by Does not apply route.    [provider]  clopidogrel (PLAVIX) 75 MG tablet TAKE ONE TABLET BY MOUTH EVERY MORNING 05/05/16   [provider]  Cyanocobalamin (VITAMIN B-12) 5000 MCG SUBL Place under the tongue.    [provider]  Dulaglutide 0.75 MG/0.5ML SOPN Inject into the skin. 06/13/15   [provider]  enoxaparin (LOVENOX) 40 MG/0.4ML injection Inject 40 mg into the skin daily.    [provider]  Exenatide ER (BYDUREON) 2 MG PEN Inject into the skin. 09/16/17   [provider]  furosemide (LASIX) 40 MG tablet Take by mouth. 04/06/17 05/04/18  [provider]  metFORMIN (GLUCOPHAGE-XR) 500 MG 24 hr tablet 4 TABLETS BY MOUTH AT BEDTIME 09/01/16   [provider]  niacin 250 MG tablet Take by mouth.    [provider]  nitroGLYCERIN (NITROSTAT) 0.4 MG SL tablet PLACE 1 TABLET UNDER TONGUE EVERY 5 MIN AS NEEDED FOR CHEST  PAIN IF NO RELIEF IN15 MIN CALL 911 (MAX 3 TABS) 01/02/16   [provider]  ondansetron (ZOFRAN) 4 MG tablet Take by mouth. 01/12/15   [provider]  oxyCODONE-acetaminophen (PERCOCET/ROXICET) 5-325 MG tablet  08/19/16   [provider]  OXYCONTIN 20 MG 12 hr tablet  09/01/16   [provider]  pantoprazole (PROTONIX) 40 MG tablet Take 40 mg by mouth daily. 03/17/18   [provider]  Polyethyl Glycol-Propyl Glycol 0.4-0.3 % SOLN Apply to eye.    [provider]  promethazine (PHENERGAN) 25 MG tablet Take by mouth.    [provider]  spironolactone (ALDACTONE) 25 MG tablet  09/01/16   [provider]  TIKOSYN 500 MCG capsule  06/30/16   [provider]  topiramate (TOPAMAX) 25 MG tablet TAKE 3 TABLETS (75 MG TOTAL) BY MOUTH 2 (TWO) TIMES DAILY 08/20/17   [provider]  warfarin (COUMADIN) 1 MG tablet TAKE ONE TABLET AT BEDTIME 06/30/16   [provider]  warfarin (COUMADIN) 6 MG tablet  09/01/16   [provider]      VITAL SIGNS:  Blood pressure 110/61,  pulse 74, temperature 98.4 F (36.9 C), temperature source Oral, resp. rate 18, SpO2 98 %.  PHYSICAL EXAMINATION:   Physical Exam  GENERAL:  62 y.o.-year-old patient lying in the bed with no acute distress.  EYES: Pupils equal, round, reactive to light and accommodation. No scleral icterus. Extraocular muscles intact.  HEENT: Head atraumatic, normocephalic. Oropharynx and nasopharynx clear.  NECK:  Supple, no jugular venous distention. No thyroid enlargement, no tenderness.  LUNGS: Normal breath sounds bilaterally, no wheezing, rales,rhonchi or crepitation. No use of accessory muscles of respiration. Decreased bibasilar breath sounds CARDIOVASCULAR: S1, S2 normal. No  rubs, or gallops. Loud 3/6 systolic murmur present ABDOMEN: Soft, nontender, nondistended. Bowel sounds present. No organomegaly or mass.  EXTREMITIES: No pedal edema,  cyanosis, or clubbing.  NEUROLOGIC: Cranial nerves II through XII are intact. Muscle strength 5/5 in all extremities. Sensation intact. Gait not checked. Global weakness noted.  PSYCHIATRIC: The patient is alert and oriented x 3.  SKIN: No obvious rash, lesion, or ulcer.   LABORATORY PANEL:   CBC Recent Labs  Lab 05/10/18 2047  WBC 9.3  HGB 10.4*  HCT 29.2*  PLT 263   ------------------------------------------------------------------------------------------------------------------  Chemistries  Recent Labs  Lab 05/10/18 2047  NA 132*  K 4.6  CL 97*  CO2 29  GLUCOSE 476*  BUN 32*  CREATININE 1.71*  CALCIUM 8.6*  AST 22  ALT 24  ALKPHOS 72  BILITOT 0.4   ------------------------------------------------------------------------------------------------------------------  Cardiac Enzymes No results for input(s): TROPONINI in the last 168 hours. ------------------------------------------------------------------------------------------------------------------  RADIOLOGY:  No results found.  EKG:   Orders placed or performed during the hospital encounter of 04/07/05  . EKG 12-Lead  . EKG 12-Lead    IMPRESSION AND PLAN:   Calil Amor  is a 62 y.o. 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  1.  Ischemic colitis-confirmed by colonoscopy last week.  Had ulcerations in ascending colon.  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  Vascular has been consulted for angiogram -Heparin drip for bridging. -Possible angiogram in a.m. transfuse if hemoglobin dropping -GI has been consulted  2.  Diabetes mellitus with hyperglycemia-patient takes  glimepiride at home which we will continue.  Hold metformin for angiogram -Patient refused any insulin as according to him and his wife, he has insulin sensitivity and will become hyperglycemic with it.  3.  Atrial fibrillation-rate controlled.  On Tikosyn, Coreg -Currently on heparin drip -Patient is also status post aortic valve replacement surgery currently being bridged with heparin IV  4.  CAD-stable at this time.  5.  Chronic low back pain-continue home pain medications  6.  DVT prophylaxis-on heparin drip    All the records are reviewed and case discussed with ED provider. Management plans discussed with the patient, family and they are in agreement.  CODE STATUS: DNR  TOTAL TIME TAKING CARE OF THIS PATIENT: 55 minutes.    Gladstone Lighter M.D on 05/10/2018 at 9:18 PM  Between 7am to 6pm - Pager - [phone]  After 6pm go to [email or web address] - password [email or web address] [place] [facility]  Office  [phone]  CC: Primary care physician; Rusty Aus, MD

## 2018-05-11 ENCOUNTER — Telehealth: Payer: Self-pay | Admitting: Urology

## 2018-05-11 ENCOUNTER — Other Ambulatory Visit (INDEPENDENT_AMBULATORY_CARE_PROVIDER_SITE_OTHER): Payer: Self-pay | Admitting: Vascular Surgery

## 2018-05-11 ENCOUNTER — Encounter
Admission: AD | Disposition: A | Discharge status: Home / Self Care | Payer: Self-pay | Source: Ambulatory Visit | Attending: Internal Medicine

## 2018-05-11 DIAGNOSIS — K551 Chronic vascular disorders of intestine: Secondary | ICD-10-CM

## 2018-05-11 HISTORY — PX: VISCERAL ARTERY INTERVENTION: CATH118277

## 2018-05-11 LAB — BASIC METABOLIC PANEL
ANION GAP: 6 (ref 5–15)
BUN: 30 mg/dL — ABNORMAL HIGH (ref 8–23)
CO2: 27 mmol/L (ref 22–32)
Calcium: 8.2 mg/dL — ABNORMAL LOW (ref 8.9–10.3)
Chloride: 102 mmol/L (ref 98–111)
Creatinine, Ser: 1.45 mg/dL — ABNORMAL HIGH (ref 0.61–1.24)
GFR, EST AFRICAN AMERICAN: 58 mL/min — AB (ref >60)
GFR, EST NON AFRICAN AMERICAN: 50 mL/min — AB (ref >60)
Glucose, Bld: 418 mg/dL — ABNORMAL HIGH (ref 70–99)
POTASSIUM: 4.4 mmol/L (ref 3.5–5.1)
SODIUM: 135 mmol/L (ref 135–145)

## 2018-05-11 LAB — HEMOGLOBIN
HEMOGLOBIN: 10.1 g/dL — AB (ref 13.0–18.0)
Hemoglobin: 9.9 g/dL — ABNORMAL LOW (ref 13.0–18.0)

## 2018-05-11 LAB — CBC
HEMATOCRIT: 28.4 % — AB (ref 40.0–52.0)
HEMOGLOBIN: 9.7 g/dL — AB (ref 13.0–18.0)
MCH: 30.9 pg (ref 26.0–34.0)
MCHC: 34.2 g/dL (ref 32.0–36.0)
MCV: 90.5 fL (ref 80.0–100.0)
Platelets: 258 10*3/uL (ref 150–440)
RBC: 3.14 MIL/uL — ABNORMAL LOW (ref 4.40–5.90)
RDW: 14.5 % (ref 11.5–14.5)
WBC: 8.9 10*3/uL (ref 3.8–10.6)

## 2018-05-11 LAB — HEPARIN LEVEL (UNFRACTIONATED)
HEPARIN UNFRACTIONATED: 0.3 [IU]/mL (ref 0.30–0.70)
HEPARIN UNFRACTIONATED: 0.42 [IU]/mL (ref 0.30–0.70)
Heparin Unfractionated: 0.35 IU/mL (ref 0.30–0.70)

## 2018-05-11 SURGERY — VISCERAL ARTERY INTERVENTION
Anesthesia: Moderate Sedation

## 2018-05-11 MED ORDER — MORPHINE SULFATE (PF) 2 MG/ML IV SOLN
2.0000 mg | INTRAVENOUS | Status: DC | PRN
  Administered 2018-05-11: 2 mg via INTRAVENOUS

## 2018-05-11 MED ORDER — DIPHENHYDRAMINE HCL 50 MG/ML IJ SOLN
INTRAMUSCULAR | Status: AC
  Filled 2018-05-11: qty 1

## 2018-05-11 MED ORDER — SODIUM CHLORIDE 0.9% FLUSH: 3.0000 mL | INTRAVENOUS | Status: DC | PRN

## 2018-05-11 MED ORDER — CEFAZOLIN SODIUM-DEXTROSE 1-4 GM/50ML-% IV SOLN
1.0000 g | Freq: Once | INTRAVENOUS | Status: AC
  Administered 2018-05-11: 1 g via INTRAVENOUS

## 2018-05-11 MED ORDER — FENTANYL CITRATE (PF) 100 MCG/2ML IJ SOLN
INTRAMUSCULAR | Status: AC
  Filled 2018-05-11: qty 2

## 2018-05-11 MED ORDER — IOPAMIDOL (ISOVUE-300) INJECTION 61%
INTRAVENOUS | Status: DC | PRN
  Administered 2018-05-11: 75 mL via INTRA_ARTERIAL

## 2018-05-11 MED ORDER — OXYCODONE-ACETAMINOPHEN 5-325 MG PO TABS
2.0000 | ORAL_TABLET | Freq: Four times a day (QID) | ORAL | Status: DC | PRN
  Administered 2018-05-11: 2 via ORAL
  Filled 2018-05-11: qty 2

## 2018-05-11 MED ORDER — DIPHENHYDRAMINE HCL 50 MG/ML IJ SOLN
INTRAMUSCULAR | Status: DC | PRN
  Administered 2018-05-11: 12.5 mg via INTRAVENOUS

## 2018-05-11 MED ORDER — MIDAZOLAM HCL 2 MG/2ML IJ SOLN
INTRAMUSCULAR | Status: DC | PRN
  Administered 2018-05-11: 2 mg via INTRAVENOUS
  Administered 2018-05-11: 1 mg via INTRAVENOUS

## 2018-05-11 MED ORDER — MIDAZOLAM HCL 5 MG/5ML IJ SOLN
INTRAMUSCULAR | Status: AC
  Filled 2018-05-11: qty 5

## 2018-05-11 MED ORDER — ONDANSETRON HCL 4 MG/2ML IJ SOLN: 4.0000 mg | Freq: Four times a day (QID) | INTRAMUSCULAR | Status: DC | PRN

## 2018-05-11 MED ORDER — MORPHINE SULFATE (PF) 2 MG/ML IV SOLN
INTRAVENOUS | Status: AC
  Filled 2018-05-11: qty 1

## 2018-05-11 MED ORDER — ATORVASTATIN CALCIUM 10 MG PO TABS: 10.0000 mg | ORAL_TABLET | Freq: Every day | ORAL | Status: DC

## 2018-05-11 MED ORDER — HEPARIN (PORCINE) IN NACL 100-0.45 UNIT/ML-% IJ SOLN
INTRAMUSCULAR | Status: AC
  Administered 2018-05-11: 1200 [IU]/h via INTRAVENOUS
  Filled 2018-05-11: qty 250

## 2018-05-11 MED ORDER — HEPARIN (PORCINE) IN NACL 100-0.45 UNIT/ML-% IJ SOLN
1350.0000 [IU]/h | INTRAMUSCULAR | Status: DC
  Administered 2018-05-11: 1200 [IU]/h via INTRAVENOUS

## 2018-05-11 MED ORDER — LIDOCAINE HCL (PF) 1 % IJ SOLN
INTRAMUSCULAR | Status: AC
  Filled 2018-05-11: qty 30

## 2018-05-11 MED ORDER — OXYCODONE HCL 5 MG PO TABS
5.0000 mg | ORAL_TABLET | ORAL | Status: DC | PRN
  Administered 2018-05-12: 10 mg via ORAL
  Filled 2018-05-11: qty 2

## 2018-05-11 MED ORDER — HEPARIN SODIUM (PORCINE) 1000 UNIT/ML IJ SOLN
INTRAMUSCULAR | Status: DC | PRN
  Administered 2018-05-11: 3000 [IU] via INTRAVENOUS

## 2018-05-11 MED ORDER — SODIUM CHLORIDE 0.9% FLUSH: 3.0000 mL | Freq: Two times a day (BID) | INTRAVENOUS | Status: DC

## 2018-05-11 MED ORDER — HEPARIN SODIUM (PORCINE) 1000 UNIT/ML IJ SOLN
INTRAMUSCULAR | Status: AC
  Filled 2018-05-11: qty 1

## 2018-05-11 MED ORDER — MORPHINE SULFATE (PF) 4 MG/ML IV SOLN: 2.0000 mg | INTRAVENOUS | Status: DC | PRN

## 2018-05-11 MED ORDER — SODIUM CHLORIDE 0.9 % IV SOLN: 250.0000 mL | INTRAVENOUS | Status: DC | PRN

## 2018-05-11 MED ORDER — HEPARIN (PORCINE) IN NACL 1000-0.9 UT/500ML-% IV SOLN
INTRAVENOUS | Status: AC
  Filled 2018-05-11: qty 1000

## 2018-05-11 MED ORDER — FENTANYL CITRATE (PF) 100 MCG/2ML IJ SOLN
INTRAMUSCULAR | Status: DC | PRN
  Administered 2018-05-11 (×2): 50 ug via INTRAVENOUS

## 2018-05-11 MED ORDER — SODIUM CHLORIDE 0.9 % IV SOLN: INTRAVENOUS | Status: AC

## 2018-05-11 MED ORDER — SODIUM CHLORIDE 0.9 % IV SOLN
INTRAVENOUS | Status: DC
  Administered 2018-05-11: 15:00:00 via INTRAVENOUS

## 2018-05-11 SURGICAL SUPPLY — 21 items
BALLN LUTONIX DCB 6X40X130 (BALLOONS) ×3
BALLN LUTONIX DCB 7X40X130 (BALLOONS) ×3
BALLOON LUTONIX DCB 6X40X130 (BALLOONS) IMPLANT
BALLOON LUTONIX DCB 7X40X130 (BALLOONS) IMPLANT
CATH BEACON 5 .038 100 VERT TP (CATHETERS) ×2 IMPLANT
CATH PIG 70CM (CATHETERS) ×2 IMPLANT
CATH VS15FR (CATHETERS) ×2 IMPLANT
DEVICE PRESTO INFLATION (MISCELLANEOUS) ×2 IMPLANT
DEVICE STARCLOSE SE CLOSURE (Vascular Products) ×2 IMPLANT
DEVICE TORQUE .025-.038 (MISCELLANEOUS) ×2 IMPLANT
GLIDEWIRE STIFF .35X180X3 HYDR (WIRE) ×2 IMPLANT
NDL ENTRY 21GA 7CM ECHOTIP (NEEDLE) IMPLANT
NEEDLE ENTRY 21GA 7CM ECHOTIP (NEEDLE) ×3 IMPLANT
PACK ANGIOGRAPHY (CUSTOM PROCEDURE TRAY) ×2 IMPLANT
SET INTRO CAPELLA COAXIAL (SET/KITS/TRAYS/PACK) ×2 IMPLANT
SHEATH BRITE TIP 5FRX11 (SHEATH) ×2 IMPLANT
SHEATH HIGHFLEX ANSEL 6FRX55 (SHEATH) ×2 IMPLANT
TUBING CONTRAST HIGH PRESS 72 (TUBING) ×2 IMPLANT
WIRE J 3MM .035X145CM (WIRE) ×2 IMPLANT
WIRE MAGIC TOR.035 180C (WIRE) ×2 IMPLANT
WIRE MAGIC TORQUE 260C (WIRE) ×2 IMPLANT

## 2018-05-11 NOTE — Progress Notes (Signed)
[facility] NAME: Tyrease Vandeberg    MR#:  [id number]  DATE OF BIRTH:  [birth year]  SUBJECTIVE:  CHIEF COMPLAINT: Patient is complaining of intermittent episodes of abdominal pain.  Wife at bedside  REVIEW OF SYSTEMS:  CONSTITUTIONAL: No fever, fatigue or weakness.  EYES: No blurred or double vision.  EARS, NOSE, AND THROAT: No tinnitus or ear pain.  RESPIRATORY: No cough, shortness of breath, wheezing or hemoptysis.  CARDIOVASCULAR: No chest pain, orthopnea, edema.  GASTROINTESTINAL: No nausea, vomiting, diarrhea , patient reports intermittent episodes of abdominal pain.  GENITOURINARY: No dysuria, hematuria.  ENDOCRINE: No polyuria, nocturia,  HEMATOLOGY: No anemia, easy bruising or bleeding SKIN: No rash or lesion. MUSCULOSKELETAL: No joint pain or arthritis.   NEUROLOGIC: No tingling, numbness, weakness.  PSYCHIATRY: No anxiety or depression.   DRUG ALLERGIES:  No Known Allergies  VITALS:  Blood pressure (!) 113/56, pulse 70, temperature 98.4 F (36.9 C), temperature source Oral, resp. rate 18, SpO2 96 %.  PHYSICAL EXAMINATION:  GENERAL:  62 y.o.-year-old patient lying in the bed with no acute distress.  EYES: Pupils equal, round, reactive to light and accommodation. No scleral icterus. Extraocular muscles intact.  HEENT: Head atraumatic, normocephalic. Oropharynx and nasopharynx clear.  NECK:  Supple, no jugular venous distention. No thyroid enlargement, no tenderness.  LUNGS: Normal breath sounds bilaterally, no wheezing, rales,rhonchi or crepitation. No use of accessory muscles of respiration.  CARDIOVASCULAR: S1, S2 normal. No murmurs, rubs, or gallops.  ABDOMEN: Soft,  mild diffuse tenderness is present no rebound tenderness , nondistended. Bowel sounds present.  EXTREMITIES: No pedal edema, cyanosis, or clubbing.  NEUROLOGIC: Cranial nerves II through XII are intact. Muscle strength 5/5 in all extremities.  Sensation intact. Gait not checked.  PSYCHIATRIC: The patient is alert and oriented x 3.  SKIN: No obvious rash, lesion, or ulcer.    LABORATORY PANEL:   CBC Recent Labs  Lab 05/11/18 0202  05/11/18 1850  WBC 8.9  --   --   HGB 9.7*   < > 10.1*  HCT 28.4*  --   --   PLT 258  --   --    < > = values in this interval not displayed.   ------------------------------------------------------------------------------------------------------------------  Chemistries  Recent Labs  Lab 05/10/18 2047 05/11/18 0202  NA 132* 135  K 4.6 4.4  CL 97* 102  CO2 29 27  GLUCOSE 476* 418*  BUN 32* 30*  CREATININE 1.71* 1.45*  CALCIUM 8.6* 8.2*  AST 22  --   ALT 24  --   ALKPHOS 72  --   BILITOT 0.4  --    ------------------------------------------------------------------------------------------------------------------  Cardiac Enzymes No results for input(s): TROPONINI in the last 168 hours. ------------------------------------------------------------------------------------------------------------------  RADIOLOGY:  No results found.  EKG:   Orders placed or performed during the hospital encounter of 05/10/18  . EKG 12-Lead  . EKG 12-Lead    ASSESSMENT AND PLAN:    Tyon Cerasoli  is a 62 y.o. 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  #  Ischemic colitis-confirmed by colonoscopy last week.  Had ulcerations in ascending colon.  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  Vascular has been consulted for angiogram -  Patient was seen by Dr. Delana Meyer and had angiogram-drug-eluting angioplasty balloon placed -Heparin drip  -Seen by gastroenterology  Dr. Gustavo Lah  2.  Diabetes mellitus with hyperglycemia-patient takes glimepiride at home which we will continue.  Hold metformin for angiogram -Patient refused any insulin as according to him and his wife, he has insulin sensitivity and will become hyperglycemic with it.  3.  Atrial fibrillation-rate controlled.  On Tikosyn, Coreg -Currently on heparin drip -Patient is also status post aortic valve replacement surgery currently being bridged with heparin IV  4.  CAD-stable at this time.  5.  Chronic low back pain-continue home pain medications  6.  DVT prophylaxis-on heparin drip      All the records are reviewed and case discussed with Care Management/Social Workerr. Management plans discussed with the patient, family and they are in agreement.  CODE STATUS: DNR  TOTAL TIME TAKING CARE OF THIS PATIENT: 34 minutes.   POSSIBLE D/C IN 1-2  DAYS, DEPENDING ON CLINICAL CONDITION.  Note: This dictation was prepared with Dragon dictation along with smaller phrase technology. Any transcriptional errors that result from this process are unintentional.   Nicholes Mango M.D on 05/11/2018 at 8:50 PM  Between 7am to 6pm - Pager - [phone] After 6pm go to [email or web address] - password [email or web address] [facility]  Office  [phone]  CC: Primary care physician; Rusty Aus, MD

## 2018-05-11 NOTE — Progress Notes (Signed)
Inpatient Diabetes Program Recommendations  AACE/ADA: New Consensus Statement on Inpatient Glycemic Control (2015)  Target Ranges:  Prepandial:   less than 140 mg/dL      Peak postprandial:   less than 180 mg/dL (1-2 hours)      Critically ill patients:  140 - 180 mg/dL   Results for Michael Patrick, Michael Patrick (MRN [id number]) as of 05/11/2018 11:30  Ref. Range 05/10/2018 19:07 05/10/2018 20:47 05/11/2018 02:02  Glucose Latest Ref Range: 70 - 99 mg/dL 523 (HH) 476 (H) 418 (H)    Admit with: Ischemic colitis-confirmed by colonoscopy last week  History: DM, CHF, CVA  Home DM Meds: Trulicity 5.88 mg Qweek       Bydureon 2 mg Qweek       Metformin 2000 mg QHS          Amaryl 4 mg BID  Current Insulin Orders: None      MD- please note that admitting MD addressed taking Insulin in-hospital with patient.  Patient refuses to take Insulin stating that he is "sensitive" to insulin and will not take any.  Note that lab glucose this AM was 418 mg/dl.  If patient can bring his Trulicity and Bydureon, we can give these 2 meds to patient.  Hospital does NOT stock Trulicity and Bydureon.  Not sure what else to recommend if patient will refuse Insulin.  May need some IVF if glucose levels stay elevated to prevent dehydration?    --Will follow patient during hospitalization--  Wyn Quaker RN, MSN, CDE Diabetes Coordinator Inpatient Glycemic Control Team Team Pager: [phone] (8a-5p)

## 2018-05-11 NOTE — Progress Notes (Signed)
ANTICOAGULATION CONSULT NOTE - Initial Consult  Pharmacy Consult for heparin Indication: AF with mechanical valve  No Known Allergies  Patient Measurements:   Heparin Dosing Weight: 83 kg  Vital Signs: Temp: 98.4 F (36.9 C) (07/30 1948) Temp Source: Oral (07/30 1948) BP: 113/56 (07/30 1948) Pulse Rate: 70 (07/30 1948)  Labs: Recent Labs    05/10/18 1907 05/10/18 2047 05/11/18 0202 05/11/18 0740 05/11/18 1850  HGB 10.6* 10.4* 9.7* 9.9* 10.1*  HCT  --  29.2* 28.4*  --   --   PLT  --  263 258  --   --   APTT  --  32  --   --   --   LABPROT  --  16.4*  --   --   --   INR  --  1.33  --   --   --   HEPARINUNFRC  --   --  0.35 0.30 0.42  CREATININE 1.80* 1.71* 1.45*  --   --     Estimated Creatinine Clearance: 54.6 mL/min (A) (by C-G formula based on SCr of 1.45 mg/dL (H)).   Medical History: Past Medical History:  Diagnosis Date  . Carotid artery occlusion   . CHF (congestive heart failure) ([place])   . Diabetes mellitus without complication ([place])   . Hyperlipidemia   . Hypertension   . Stroke [facility])     Medications:  Infusions:  . sodium chloride 100 mL/hr at 05/11/18 1822  . sodium chloride    . heparin 1,200 Units/hr (05/11/18 1823)    Assessment: 24 yom cc ischemic colitis.Patient was referred from GI OP for still mostly bloody stools and stomach pain. PMH HTN, stroke, PVD, CHF, DM, HLD. Patient takes VKA PTA for AF. Earlier this month he had some GI work up that may include EGD and colonoscopy. He had been holding VKA due to supratherapeutic INR and blood in stool. Pharmacy consulted to dose heparin for AF with mechanical valve.   Goal of Therapy:  Heparin level 0.3-0.7 units/ml Monitor platelets by anticoagulation protocol: Yes   Plan:  Baseline labs and PTA list pending - will not start heparin until baseline labs are available due to GIB and recent supratherapeutic INR. Once labs return we will start heparin per protocol Give 4200 units bolus x  1 Start heparin infusion at 1200 units/hr Check anti-Xa level in 6 hours and daily while on heparin Continue to monitor H&H and platelets  20:33 Update - patient had CBC, CMP, and PT/INR at outpatient office today. INR 1.2. Patient is now refusing aPTT and HL (which we require since he had been bridged with lovenox recently, last dose yesterday). Spoke with Dr. Tressia Miners - she will speak with nurse and try to get aPTT and HL. If not able she will call pharmacy back and update [place] on plan.   07/30 @ 0200 HL 0.35 therapeutic. Will continue current rate and recheck @ 0800. hgb 10.4 >> 9.7 will continue to monitor.  7/30 @ 0740 HL 0.30. Heparin level just therapeutic. I do not want to be overlly aggressive as pt has drop in Hgb w/ bleeding. Therefore I will keep the rate the same, but will recheck level at 2000 instead of the AM.  7/30 18:50 heparin had been shut off for 2 hours due to procedure. This level likely inaccurate. Will recheck HL in 6 hours.  Laural Benes, Pharm.D., BCPS Clinical Pharmacist 05/11/2018,8:18 PM

## 2018-05-11 NOTE — Care Management (Signed)
Direct admit for angiogram.  Recent colonoscopy revealing ischemic colitis. Onset of melena and 2 point drop in hemoglobin over past week.  Chronic coumadin which at present is on hold.

## 2018-05-11 NOTE — Op Note (Signed)
[facility] Percutaneous Study/Intervention Procedural Note   Date: 05/11/2018  Surgeon(s): [name], MD  Assistants: none  Pre-operative Diagnosis: 1.  Right acsending colonic mesenteric ischemia 2.  Superior mesenteric artery stenosis   Post-operative diagnosis: Same  Procedure(s) Performed: 1. Ultrasound guidance for vascular access right femoral artery femoral artery 2. Catheter placement into SMA from right femoral approach 3. Aortogram and selective angiogram of the SMA 4. PTA of the SMA with submillimeter mm diameter 40 Lutonix drug-eluting angioplasty balloon 5. StarClose closure device right femoral artery  Contrast: 75 cc  Fluoro time: 4.9 minutes  EBL: 20 cc  Anesthesia: Approximately 51 minutes of Moderate conscious sedation using intravenous Versed and Fentanyl  Indications: Patient is a 62 y.o. male who has symptoms consistent with mesenteric ischemia. The patient has a CT angiogram showing stenosis of the SMA at its origin.  Colonoscopy demonstrated biopsy-proven mesenteric ischemia in the acsending colon.  The patient is brought in for angiography for further evaluation and potential treatment. Risks and benefits are discussed and informed consent is obtained  Procedure: The patient was identified and appropriate procedural time out was performed. The patient was then placed supine on the table and prepped and draped in the usual sterile fashion. Moderate conscious sedation was administered during a face to face encounter with the patient throughout the procedure with my supervision of the RN administering medicines and monitoring the patient's vital signs, pulse oximetry, telemetry and mental status throughout from the start of the procedure until the patient was taken to the recovery room. Ultrasound was used to evaluate the right common femoral artery.  It was patent . A digital ultrasound image was acquired. A micropuncture needle was used to access the right common femoral artery under direct ultrasound guidance and a permanent image was performed. A microwire followed by micro-sheath was then inserted.  A 0.035 J wire was advanced without resistance and a 5Fr sheath was placed. Pigtail catheter was placed into the aorta and an AP aortogram was performed. This demonstrated the approximate location of the visceral vessels.  Excellent filling of the hepatic and splenic were noted there was relatively poor filling of the SMA branches in the AP projection noted.  I transitioned to the lateral projection to image the celiac and SMA. The lateral image demonstrated significant stenosis of the SMA.   The patient was given 4000 units of IV heparin.We upsized to a 6 Fr sheath Ansell high flex.  A V S1 catheter was used to selectively cannulate the SMA. The wire was then negotiated more distally into the SMA to allow for good purchase the catheter was then seated and the Ansell sheath advanced so that the tip of the sheath was right at the origin of the SMA.  Hand-injection of contrast was then utilized to demonstrate the origin of the SMA and a magnified view.  This demonstrated a 65 to 70% stenosis of the origin of the SMA extending approximately 1 to 2 cm distally. Based on her symptoms and these findings, I elected to treat the origin of the SMA to try to improve the patient's clinical course. I crossed the lesion without difficulty with Kumpe catheter and then exchanged the Glidewire for a Magic torque wire. I then used a 6 mm diameter x 40 mm length Lutonix angioplasty balloon to perform percutaneous transluminal angioplasty of the SMA. I inflated the balloon to 10 Atm and held the inflation for 1 minute. On completion angiogram following this angioplasty, 40 %  residual stenosis was identified.  Therefore, I advanced a 7 mm x 40 mm Lutonix drug-eluting  balloon across the lesion and angioplastied the site a second time inflation was to 8 atm for 1 full minute.  Follow-up imaging now demonstrated less than 5% residual stenosis.  Kumpe catheter was advanced over the wire the detector reposition to AP and magnified selected images of the SMA were then obtained.   At this point, I elected to terminate the procedure. The diagnostic catheter was removed. StarClose closure device was deployed in usual fashion with excellent hemostatic result. The patient was taken to the recovery room in stable condition having tolerated the procedure well.     Findings:Aorta demonstrates mild atherosclerotic changes no hemodynamically significant findings are noted.  The celiac artery is widely patent at its origin and the hepatic and splenic arteries are widely patent.  The SMA appears to have a 65 to 70% stenosis at its origin.  Distally the SMA appears normal.  Following angioplasty to 7 mm there is resolution of this ostial lesion.  Disposition: Patient was taken to the recovery room in stable condition having tolerated the procedure well.  Complications: None  [name] 05/11/2018 5:36 PM   This note was created with Dragon Medical transcription system. Any errors in dictation are purely unintentional.

## 2018-05-11 NOTE — Progress Notes (Signed)
ANTICOAGULATION CONSULT NOTE - Initial Consult  Pharmacy Consult for heparin Indication: AF with mechanical valve  No Active Allergies  Patient Measurements:   Heparin Dosing Weight: 83 kg  Vital Signs: Temp: 98.4 F (36.9 C) (07/30 0728) Temp Source: Oral (07/30 0728) BP: 122/57 (07/30 0728) Pulse Rate: 69 (07/30 0728)  Labs: Recent Labs    05/10/18 1907 05/10/18 2047 05/11/18 0202 05/11/18 0740  HGB 10.6* 10.4* 9.7* 9.9*  HCT  --  29.2* 28.4*  --   PLT  --  263 258  --   APTT  --  32  --   --   LABPROT  --  16.4*  --   --   INR  --  1.33  --   --   HEPARINUNFRC  --   --  0.35 0.30  CREATININE 1.80* 1.71* 1.45*  --     Estimated Creatinine Clearance: 54.6 mL/min (A) (by C-G formula based on SCr of 1.45 mg/dL (H)).   Medical History: Past Medical History:  Diagnosis Date  . Carotid artery occlusion   . CHF (congestive heart failure) ([place])   . Diabetes mellitus without complication ([place])   . Hyperlipidemia   . Hypertension   . Stroke [facility])     Medications:  Infusions:  . heparin 1,200 Units/hr (05/10/18 2249)    Assessment: 61 yom cc ischemic colitis.Patient was referred from GI OP for still mostly bloody stools and stomach pain. PMH HTN, stroke, PVD, CHF, DM, HLD. Patient takes VKA PTA for AF. Earlier this month he had some GI work up that may include EGD and colonoscopy. He had been holding VKA due to supratherapeutic INR and blood in stool. Pharmacy consulted to dose heparin for AF with mechanical valve.   Goal of Therapy:  Heparin level 0.3-0.7 units/ml Monitor platelets by anticoagulation protocol: Yes   Plan:  Baseline labs and PTA list pending - will not start heparin until baseline labs are available due to GIB and recent supratherapeutic INR. Once labs return we will start heparin per protocol Give 4200 units bolus x 1 Start heparin infusion at 1200 units/hr Check anti-Xa level in 6 hours and daily while on heparin Continue to monitor H&H and  platelets  20:33 Update - patient had CBC, CMP, and PT/INR at outpatient office today. INR 1.2. Patient is now refusing aPTT and HL (which we require since he had been bridged with lovenox recently, last dose yesterday). Spoke with Dr. Tressia Miners - she will speak with nurse and try to get aPTT and HL. If not able she will call pharmacy back and update [place] on plan.   07/30 @ 0200 HL 0.35 therapeutic. Will continue current rate and recheck @ 0800. hgb 10.4 >> 9.7 will continue to monitor.  7/30 @ 0740 HL 0.30. Heparin level just therapeutic. I do not want to be overlly aggressive as pt has drop in Hgb w/ bleeding. Therefore I will keep the rate the same, but will recheck level at 2000 instead of the AM.  Ramond Dial, Pharm.D., BCPS Clinical Pharmacist 05/11/2018,9:02 AM

## 2018-05-11 NOTE — Telephone Encounter (Signed)
FYI Patient's wife called today and canceled his follow up cysto with you, she left a voicemail did not say why or that they wanted to reschd.   Sharyn Lull

## 2018-05-11 NOTE — Consult Note (Signed)
Subjective: Patient seen earlier this morning.  Patient seen for ischemic colitis, melena.  Patient did well overnight blood pressure better than on admission feeling a little better with hydration, mild abdominal discomfort generalized mostly central though.  No bowel movement since yesterday no melena since yesterday.  No nausea or vomiting.  Objective: Vital signs in last 24 hours: Temp:  [97.8 F (36.6 C)-98.5 F (36.9 C)] 98.4 F (36.9 C) (07/30 0728) Pulse Rate:  [69-74] 69 (07/30 0728) Resp:  [18-19] 19 (07/30 0329) BP: (100-133)/(57-62) 122/57 (07/30 0728) SpO2:  [95 %-100 %] 99 % (07/30 0728) Blood pressure (!) 122/57, pulse 69, temperature 98.4 F (36.9 C), temperature source Oral, resp. rate 19, SpO2 99 %.   Intake/Output from previous day: 07/29 0701 - 07/30 0700 In: 74.2 [I.V.:74.2] Out: -   Intake/Output this shift: No intake/output data recorded.   General appearance: 62 year old male no distress Resp: Bilaterally clear to auscultation Cardio: Regular rate and rhythm without rub or gallop GI: Soft minimal discomfort to palpation no masses or rebound.  Bowel sounds are positive normoactive Extremities: Clubbing cyanosis or edema   Lab Results: Results for orders placed or performed during the hospital encounter of 05/10/18 (from the past 24 hour(s))  Hemoglobin     Status: Abnormal   Collection Time: 05/10/18  7:07 PM  Result Value Ref Range   Hemoglobin 10.6 (L) 13.0 - 18.0 g/dL  Basic metabolic panel     Status: Abnormal   Collection Time: 05/10/18  7:07 PM  Result Value Ref Range   Sodium 133 (L) 135 - 145 mmol/L   Potassium 4.7 3.5 - 5.1 mmol/L   Chloride 98 98 - 111 mmol/L   CO2 26 22 - 32 mmol/L   Glucose, Bld 523 (HH) 70 - 99 mg/dL   BUN 32 (H) 8 - 23 mg/dL   Creatinine, Ser 1.80 (H) 0.61 - 1.24 mg/dL   Calcium 8.7 (L) 8.9 - 10.3 mg/dL   GFR calc non Af Amer 39 (L) >60 mL/min   GFR calc Af Amer 45 (L) >60 mL/min   Anion gap 9 5 - 15  CBC      Status: Abnormal   Collection Time: 05/10/18  8:47 PM  Result Value Ref Range   WBC 9.3 3.8 - 10.6 K/uL   RBC 3.27 (L) 4.40 - 5.90 MIL/uL   Hemoglobin 10.4 (L) 13.0 - 18.0 g/dL   HCT 29.2 (L) 40.0 - 52.0 %   MCV 89.3 80.0 - 100.0 fL   MCH 31.8 26.0 - 34.0 pg   MCHC 35.6 32.0 - 36.0 g/dL   RDW 14.3 11.5 - 14.5 %   Platelets 263 150 - 440 K/uL  APTT     Status: None   Collection Time: 05/10/18  8:47 PM  Result Value Ref Range   aPTT 32 24 - 36 seconds  Comprehensive metabolic panel     Status: Abnormal   Collection Time: 05/10/18  8:47 PM  Result Value Ref Range   Sodium 132 (L) 135 - 145 mmol/L   Potassium 4.6 3.5 - 5.1 mmol/L   Chloride 97 (L) 98 - 111 mmol/L   CO2 29 22 - 32 mmol/L   Glucose, Bld 476 (H) 70 - 99 mg/dL   BUN 32 (H) 8 - 23 mg/dL   Creatinine, Ser 1.71 (H) 0.61 - 1.24 mg/dL   Calcium 8.6 (L) 8.9 - 10.3 mg/dL   Total Protein 6.1 (L) 6.5 - 8.1 g/dL   Albumin 3.5 3.5 -  5.0 g/dL   AST 22 15 - 41 U/L   ALT 24 0 - 44 U/L   Alkaline Phosphatase 72 38 - 126 U/L   Total Bilirubin 0.4 0.3 - 1.2 mg/dL   GFR calc non Af Amer 41 (L) >60 mL/min   GFR calc Af Amer 48 (L) >60 mL/min   Anion gap 6 5 - 15  Protime-INR     Status: Abnormal   Collection Time: 05/10/18  8:47 PM  Result Value Ref Range   Prothrombin Time 16.4 (H) 11.4 - 15.2 seconds   INR 9.60   Basic metabolic panel     Status: Abnormal   Collection Time: 05/11/18  2:02 AM  Result Value Ref Range   Sodium 135 135 - 145 mmol/L   Potassium 4.4 3.5 - 5.1 mmol/L   Chloride 102 98 - 111 mmol/L   CO2 27 22 - 32 mmol/L   Glucose, Bld 418 (H) 70 - 99 mg/dL   BUN 30 (H) 8 - 23 mg/dL   Creatinine, Ser 1.45 (H) 0.61 - 1.24 mg/dL   Calcium 8.2 (L) 8.9 - 10.3 mg/dL   GFR calc non Af Amer 50 (L) >60 mL/min   GFR calc Af Amer 58 (L) >60 mL/min   Anion gap 6 5 - 15  CBC     Status: Abnormal   Collection Time: 05/11/18  2:02 AM  Result Value Ref Range   WBC 8.9 3.8 - 10.6 K/uL   RBC 3.14 (L) 4.40 - 5.90 MIL/uL    Hemoglobin 9.7 (L) 13.0 - 18.0 g/dL   HCT 28.4 (L) 40.0 - 52.0 %   MCV 90.5 80.0 - 100.0 fL   MCH 30.9 26.0 - 34.0 pg   MCHC 34.2 32.0 - 36.0 g/dL   RDW 14.5 11.5 - 14.5 %   Platelets 258 150 - 440 K/uL  Heparin level (unfractionated)     Status: None   Collection Time: 05/11/18  2:02 AM  Result Value Ref Range   Heparin Unfractionated 0.35 0.30 - 0.70 IU/mL  Hemoglobin     Status: Abnormal   Collection Time: 05/11/18  7:40 AM  Result Value Ref Range   Hemoglobin 9.9 (L) 13.0 - 18.0 g/dL  Heparin level (unfractionated)     Status: None   Collection Time: 05/11/18  7:40 AM  Result Value Ref Range   Heparin Unfractionated 0.30 0.30 - 0.70 IU/mL     Recent Labs    05/10/18 2047 05/11/18 0202 05/11/18 0740  WBC 9.3 8.9  --   HGB 10.4* 9.7* 9.9*  HCT 29.2* 28.4*  --   PLT 263 258  --    BMET Recent Labs    05/10/18 1907 05/10/18 2047 05/11/18 0202  NA 133* 132* 135  K 4.7 4.6 4.4  CL 98 97* 102  CO2 26 29 27   GLUCOSE 523* 476* 418*  BUN 32* 32* 30*  CREATININE 1.80* 1.71* 1.45*  CALCIUM 8.7* 8.6* 8.2*   LFT Recent Labs    05/10/18 2047  PROT 6.1*  ALBUMIN 3.5  AST 22  ALT 24  ALKPHOS 72  BILITOT 0.4   PT/INR Recent Labs    05/10/18 2047  LABPROT 16.4*  INR 1.33   Hepatitis Panel No results for input(s): HEPBSAG, HCVAB, HEPAIGM, HEPBIGM in the last 72 hours. C-Diff No results for input(s): CDIFFTOX in the last 72 hours. No results for input(s): CDIFFPCR in the last 72 hours.   Studies/Results: No results found.  Scheduled Inpatient  Medications:   . carvedilol  12.5 mg Oral BID  . dofetilide  250 mcg Oral BID  . gabapentin  600 mg Oral QID  . magnesium oxide  200 mg Oral BID  . oxyCODONE  20 mg Oral Q12H  . pantoprazole  40 mg Oral BID  . topiramate  75 mg Oral BID    Continuous Inpatient Infusions:   . heparin 1,200 Units/hr (05/10/18 2249)    PRN Inpatient Medications:  acetaminophen **OR** acetaminophen, diazepam, morphine  injection, ondansetron **OR** ondansetron (ZOFRAN) IV, oxyCODONE-acetaminophen  Miscellaneous:   Assessment:  1.  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  No recurrent bowel movements since yesterday as of this a.m. 2.  Ischemic colitis.  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  Plan:  1.  Recommend vascular consultation.  I discussed this case yesterday evening with Dr. Delana Meyer.  Would continue serial hemoglobin, transfuse as needed.  At this point would hold on any bleeding scan. 2.  We will follow with you  Lollie Sails MD 05/11/2018, 1:51 PM

## 2018-05-11 NOTE — Progress Notes (Signed)
ANTICOAGULATION CONSULT NOTE - Initial Consult  Pharmacy Consult for heparin Indication: AF with mechanical valve  No Active Allergies  Patient Measurements:   Heparin Dosing Weight: 83 kg  Vital Signs: Temp: 98.4 F (36.9 C) (07/29 1917) Temp Source: Oral (07/29 1917) BP: 110/61 (07/29 1917) Pulse Rate: 74 (07/29 1917)  Labs: Recent Labs    05/10/18 1907 05/10/18 2047 05/11/18 0202  HGB 10.6* 10.4* 9.7*  HCT  --  29.2* 28.4*  PLT  --  263 258  APTT  --  32  --   LABPROT  --  16.4*  --   INR  --  1.33  --   HEPARINUNFRC  --   --  0.35  CREATININE 1.80* 1.71* 1.45*    Estimated Creatinine Clearance: 54.6 mL/min (A) (by C-G formula based on SCr of 1.45 mg/dL (H)).   Medical History: Past Medical History:  Diagnosis Date  . Carotid artery occlusion   . CHF (congestive heart failure) ([place])   . Diabetes mellitus without complication ([place])   . Hyperlipidemia   . Hypertension   . Stroke [facility])     Medications:  Infusions:  . heparin 1,200 Units/hr (05/10/18 2249)    Assessment: 61 yom cc ischemic colitis.Patient was referred from GI OP for still mostly bloody stools and stomach pain. PMH HTN, stroke, PVD, CHF, DM, HLD. Patient takes VKA PTA for AF. Earlier this month he had some GI work up that may include EGD and colonoscopy. He had been holding VKA due to supratherapeutic INR and blood in stool. Pharmacy consulted to dose heparin for AF with mechanical valve.   Goal of Therapy:  Heparin level 0.3-0.7 units/ml Monitor platelets by anticoagulation protocol: Yes   Plan:  Baseline labs and PTA list pending - will not start heparin until baseline labs are available due to GIB and recent supratherapeutic INR. Once labs return we will start heparin per protocol Give 4200 units bolus x 1 Start heparin infusion at 1200 units/hr Check anti-Xa level in 6 hours and daily while on heparin Continue to monitor H&H and platelets  20:33 Update - patient had CBC, CMP, and  PT/INR at outpatient office today. INR 1.2. Patient is now refusing aPTT and HL (which we require since he had been bridged with lovenox recently, last dose yesterday). Spoke with Dr. Tressia Miners - she will speak with nurse and try to get aPTT and HL. If not able she will call pharmacy back and update [place] on plan.   07/30 @ 0200 HL 0.35 therapeutic. Will continue current rate and recheck @ 0800. hgb 10.4 >> 9.7 will continue to monitor.  Tobie Lords, Pharm.D., BCPS Clinical Pharmacist 05/11/2018,3:20 AM

## 2018-05-12 ENCOUNTER — Encounter: Payer: Self-pay | Admitting: Vascular Surgery

## 2018-05-12 LAB — HIV ANTIBODY (ROUTINE TESTING W REFLEX): HIV Screen 4th Generation wRfx: NONREACTIVE

## 2018-05-12 LAB — HEMOGLOBIN
Hemoglobin: 10.1 g/dL — ABNORMAL LOW (ref 13.0–18.0)
Hemoglobin: 10.2 g/dL — ABNORMAL LOW (ref 13.0–18.0)

## 2018-05-12 LAB — HEPARIN LEVEL (UNFRACTIONATED): HEPARIN UNFRACTIONATED: 0.27 [IU]/mL — AB (ref 0.30–0.70)

## 2018-05-12 MED ORDER — ATORVASTATIN CALCIUM 10 MG PO TABS: 10.0000 mg | ORAL_TABLET | Freq: Every day | ORAL | 0 refills | Status: DC

## 2018-05-12 MED ORDER — HEPARIN BOLUS VIA INFUSION
1250.0000 [IU] | Freq: Once | INTRAVENOUS | Status: DC
  Filled 2018-05-12: qty 1250

## 2018-05-12 MED ORDER — CLOPIDOGREL BISULFATE 75 MG PO TABS: 75.0000 mg | ORAL_TABLET | Freq: Every day | ORAL | Status: DC

## 2018-05-12 MED ORDER — HEPARIN BOLUS VIA INFUSION
1200.0000 [IU] | Freq: Once | INTRAVENOUS | Status: DC
  Filled 2018-05-12: qty 1200

## 2018-05-12 MED ORDER — ACETAMINOPHEN 325 MG PO TABS: 650.0000 mg | ORAL_TABLET | Freq: Four times a day (QID) | ORAL | Status: DC | PRN

## 2018-05-12 NOTE — Progress Notes (Addendum)
ANTICOAGULATION CONSULT NOTE - Initial Consult  Pharmacy Consult for heparin Indication: AF with mechanical valve  No Known Allergies  Patient Measurements:   Heparin Dosing Weight: 83 kg  Vital Signs: Temp: 98.4 F (36.9 C) (07/30 1948) Temp Source: Oral (07/30 1948) BP: 113/56 (07/30 1948) Pulse Rate: 70 (07/30 1948)  Labs: Recent Labs    05/10/18 1907 05/10/18 2047  05/11/18 0202 05/11/18 0740 05/11/18 1850 05/12/18 0057  HGB 10.6* 10.4*  --  9.7* 9.9* 10.1* 10.1*  HCT  --  29.2*  --  28.4*  --   --   --   PLT  --  263  --  258  --   --   --   APTT  --  32  --   --   --   --   --   LABPROT  --  16.4*  --   --   --   --   --   INR  --  1.33  --   --   --   --   --   HEPARINUNFRC  --   --    < > 0.35 0.30 0.42 0.27*  CREATININE 1.80* 1.71*  --  1.45*  --   --   --    < > = values in this interval not displayed.    Estimated Creatinine Clearance: 54.6 mL/min (A) (by C-G formula based on SCr of 1.45 mg/dL (H)).   Medical History: Past Medical History:  Diagnosis Date  . Carotid artery occlusion   . CHF (congestive heart failure) ([place])   . Diabetes mellitus without complication ([place])   . Hyperlipidemia   . Hypertension   . Stroke [facility])     Medications:  Infusions:  . sodium chloride    . heparin 1,200 Units/hr (05/11/18 1823)    Assessment: 2 yom cc ischemic colitis.Patient was referred from GI OP for still mostly bloody stools and stomach pain. PMH HTN, stroke, PVD, CHF, DM, HLD. Patient takes VKA PTA for AF. Earlier this month he had some GI work up that may include EGD and colonoscopy. He had been holding VKA due to supratherapeutic INR and blood in stool. Pharmacy consulted to dose heparin for AF with mechanical valve.   Goal of Therapy:  Heparin level 0.3-0.7 units/ml Monitor platelets by anticoagulation protocol: Yes   Plan:  Baseline labs and PTA list pending - will not start heparin until baseline labs are available due to GIB and recent  supratherapeutic INR. Once labs return we will start heparin per protocol Give 4200 units bolus x 1 Start heparin infusion at 1200 units/hr Check anti-Xa level in 6 hours and daily while on heparin Continue to monitor H&H and platelets  20:33 Update - patient had CBC, CMP, and PT/INR at outpatient office today. INR 1.2. Patient is now refusing aPTT and HL (which we require since he had been bridged with lovenox recently, last dose yesterday). Spoke with Dr. Tressia Miners - she will speak with nurse and try to get aPTT and HL. If not able she will call pharmacy back and update [place] on plan.   07/30 @ 0200 HL 0.35 therapeutic. Will continue current rate and recheck @ 0800. hgb 10.4 >> 9.7 will continue to monitor.  7/30 @ 0740 HL 0.30. Heparin level just therapeutic. I do not want to be overlly aggressive as pt has drop in Hgb w/ bleeding. Therefore I will keep the rate the same, but will recheck level at 2000 instead  of the AM.  7/30 18:50 heparin had been shut off for 2 hours due to procedure. This level likely inaccurate. Will recheck HL in 6 hours.  07/31 @ 0100 HL 0.27 subtherapeutic. Will increase rate to 1350 units/hr w/o bolus d/t possible GI bleed and will recheck anti-Xa at 0900. Hgb stable at 10.1  [facility], Pharm.D., BCPS Clinical Pharmacist 05/12/2018,2:56 AM

## 2018-05-12 NOTE — Discharge Instructions (Signed)
Follow with primary care physician tomorrow and get PT/INR checked.  Further management of Coumadin by primary care physician bridging with Lovenox in the interim Follow-up with Dr. Ubaldo Glassing in 3 to 4 days Follow-up with vascular surgery Dr. Delana Meyer in 5 to 7 days

## 2018-05-12 NOTE — Discharge Summary (Signed)
Michael Patrick at [place] NAME: Michael Patrick    MR#:  [id number]  DATE OF BIRTH:  [birth year]  DATE OF ADMISSION:  05/10/2018 ADMITTING PHYSICIAN: Michael Lighter, MD  DATE OF DISCHARGE: 05/12/2018  PRIMARY CARE PHYSICIAN: Michael Aus, MD    ADMISSION DIAGNOSIS:  Rectal bleeding abdnormal hemoglobin  DISCHARGE DIAGNOSIS:  Active Problems:   Ischemic colitis (Michael Patrick)   SECONDARY DIAGNOSIS:   Past Medical History:  Diagnosis Date  . Carotid artery occlusion   . CHF (congestive heart failure) ([place])   . Diabetes mellitus without complication ([place])   . Hyperlipidemia   . Hypertension   . Stroke [facility])     HOSPITAL COURSE:  HPI  Michael Patrick  is a 62 y.o. 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 Patient had symptoms of gastroenteritis about a month ago with nausea vomiting and diarrhea.  Seen GI at the time and was scheduled to get EGD and colonoscopy which were done last week.  Patient Coumadin was held and was bridged with Lovenox for the procedure.  His colonoscopy showed several ulcers at the ileocecal junction and also ascending colon consistent with ischemic colitis confirmed by biopsy.  After the procedure patient had 2 days of diarrhea followed by bright red blood clots and in the last couple of days he has been having dark stool whenever he was using the bathroom.  Hemoglobin did drop 2 points from 13 to 11 within the last week.  Patient still having generalized abdominal pain and so sent him by GI for angiogram.   #Ischemic colitis-confirmed by colonoscopy last week. Had ulcerations in ascending colon. 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 Vascular has been consulted for angiogram -Patient was seen by Dr. Delana Patrick and had angiogram-drug-eluting angioplasty balloon placed 05/11/2018.  Okay to discharge patient from vascular surgery standpoint.  Okay to discharge patient with Coumadin and Plavix per Dr. Delana Patrick -Heparin drip discontinued.  First dose of Plavix given prior to the discharge -Seen by gastroenterology Dr. Gustavo Patrick.  Outpatient follow-up with Dr. Gustavo Patrick -Patient's INR was subtherapeutic.  Patient is refusing blood work -Patient called himself Michael Patrick his primary cardiologist who is going to give him Lovenox therapeutic dose subcutaneous twice a day for bridging as INR was subtherapeutic.  INR 1.33 on 05/10/2018.  Plan is to repeat PT/INR tomorrow at primary care physician's office and can resume his home dose Coumadin.  Call placed to Michael Patrick to confirm the above plan, awaiting callback -Patient is pretty stubborn and uncooperative.  Wife Michael Patrick at bedside agreeable with the plan  2. Diabetes mellitus with hyperglycemia-patient takes glimepiride at home which we will continue. HEld metformin for angiogram-resume at the time of discharge -Patient refused any insulin as according to him and his wife, he has insulin sensitivity and will become hyperglycemic with it.  3. Atrial fibrillation-rate controlled. On Tikosyn, Coreg -off  heparin drip -Patient is also status post aortic valve replacement surgery -refusing labs including PT/INR Heparin drip discontinued.  Patient wants to go home on therapeutic Lovenox for bridging Patient called himself Michael Patrick his primary cardiologist who is going to give him Lovenox therapeutic dose subcutaneous twice a day for bridging as INR was  subtherapeutic.  INR 1.33 on 05/10/2018.  Plan is to repeat PT/INR tomorrow at primary care physician's office and can resume his home dose Coumadin  4. CAD-stable at this time.  5.  Chronic low back pain-continue home pain medications  6. DVT prophylaxis-was on heparin drip     DISCHARGE CONDITIONS:   fair  CONSULTS OBTAINED:     PROCEDURES  PTA of the SMA with submillimeter mm diameter 40 Lutonix drug-eluting angioplasty balloon  DRUG ALLERGIES:  No Known Allergies  DISCHARGE MEDICATIONS:   Allergies as of 05/12/2018   No Known Allergies     Medication List    STOP taking these medications   celecoxib 200 MG capsule Commonly known as:  CELEBREX   enoxaparin 40 MG/0.4ML injection Commonly known as:  LOVENOX     TAKE these medications   acetaminophen 325 MG tablet Commonly known as:  TYLENOL Take 2 tablets (650 mg total) by mouth every 6 (six) hours as needed for mild pain (or Fever >/= 101).   aspirin EC 81 MG tablet Take 81 mg by mouth at bedtime.   atorvastatin 10 MG tablet Commonly known as:  LIPITOR Take 1 tablet (10 mg total) by mouth daily at 6 PM.   BYDUREON 2 MG Pen Generic drug:  Exenatide ER Inject into the skin.   carvedilol 12.5 MG tablet Commonly known as:  COREG   Cinnamon Bark Powd by Does not apply route.   clopidogrel 75 MG tablet Commonly known as:  PLAVIX TAKE ONE TABLET BY MOUTH EVERY MORNING   diazepam 5 MG tablet Commonly known as:  VALIUM Take 5 mg by mouth 2 (two) times daily.   Dulaglutide 0.75 MG/0.5ML Sopn Inject into the skin.   finasteride 5 MG tablet Commonly known as:  PROSCAR TAKE ONE TABLET BY MOUTH EVERY DAY   furosemide 40 MG tablet Commonly known as:  LASIX Take by mouth.   gabapentin 800 MG tablet Commonly known as:  NEURONTIN Take 600 mg by mouth 4 (four) times daily.   glimepiride 4 MG tablet Commonly known as:  AMARYL Take 4 mg by mouth 2 (two) times daily.   lisinopril 40 MG tablet Commonly known as:  PRINIVIL,ZESTRIL TAKE ONE TABLET EVERY DAY   magnesium oxide 400 MG tablet Commonly known as:  MAG-OX Take 400 mg by mouth daily. Takes 200 mg in AM and 200 mg at  bedtime   metFORMIN 500 MG 24 hr tablet Commonly known as:  GLUCOPHAGE-XR 4 TABLETS BY MOUTH AT BEDTIME   niacin 250 MG tablet Take by mouth.   NITROSTAT 0.4 MG SL tablet Generic drug:  nitroGLYCERIN PLACE 1 TABLET UNDER TONGUE EVERY 5 MIN AS NEEDED FOR CHEST PAIN IF NO RELIEF IN15 MIN CALL 911 (MAX 3 TABS)   ondansetron 4 MG tablet Commonly known as:  ZOFRAN Take by mouth.   oxyCODONE-acetaminophen 5-325 MG tablet Commonly known as:  PERCOCET/ROXICET   OXYCONTIN 20 mg 12 hr tablet Generic drug:  oxyCODONE   pantoprazole 40 MG tablet Commonly known as:  PROTONIX Take 40 mg by mouth daily.   Polyethyl Glycol-Propyl Glycol 0.4-0.3 % Soln Apply to eye.   promethazine 25 MG tablet Commonly known as:  PHENERGAN Take by mouth.   spironolactone 25 MG tablet Commonly known as:  ALDACTONE   TIKOSYN 500 MCG capsule Generic drug:  dofetilide   topiramate 25 MG tablet Commonly known as:  TOPAMAX TAKE 3 TABLETS (75 MG TOTAL) BY MOUTH 2 (TWO) TIMES DAILY   Vitamin  B-12 5000 MCG Subl Place under the tongue.   warfarin 1 MG tablet Commonly known as:  COUMADIN TAKE ONE TABLET AT BEDTIME   warfarin 6 MG tablet Commonly known as:  COUMADIN        DISCHARGE INSTRUCTIONS:   Follow with primary care physician tomorrow and get PT/INR checked.  Further management of Coumadin by primary care physician bridging with Lovenox in the interim Follow-up with Michael Patrick in 3 to 4 days Follow-up with vascular surgery Dr. Delana Patrick in 5 to 7 days  DIET:  Cardiac diet and Diabetic diet  DISCHARGE CONDITION:  Fair  ACTIVITY:  Activity as tolerated  OXYGEN:  Home Oxygen: No.   Oxygen Delivery: room air  DISCHARGE LOCATION:  home   If you experience worsening of your admission symptoms, develop shortness of breath, life threatening emergency, suicidal or homicidal thoughts you must seek medical attention immediately by calling 911 or calling your MD immediately  if symptoms  less severe.  You Must read complete instructions/literature along with all the possible adverse reactions/side effects for all the Medicines you take and that have been prescribed to you. Take any new Medicines after you have completely understood and accpet all the possible adverse reactions/side effects.   Please note  You were cared for by a hospitalist during your hospital stay. If you have any questions about your discharge medications or the care you received while you were in the hospital after you are discharged, you can call the unit and asked to speak with the hospitalist on call if the hospitalist that took care of you is not available. Once you are discharged, your primary care physician will handle any further medical issues. Please note that NO REFILLS for any discharge medications will be authorized once you are discharged, as it is imperative that you return to your primary care physician (or establish a relationship with a primary care physician if you do not have one) for your aftercare needs so that they can reassess your need for medications and monitor your lab values.     Today  No chief complaint on file.  Patient denies any pain or complaints and wants to go home As per nurses report patient left the hospital even before prescriptions are printed and given to the patient  ROS:  CONSTITUTIONAL: Denies fevers, chills. Denies any fatigue, weakness.  EYES: Denies blurry vision, double vision, eye pain. EARS, NOSE, THROAT: Denies tinnitus, ear pain, hearing loss. RESPIRATORY: Denies cough, wheeze, shortness of breath.  CARDIOVASCULAR: Denies chest pain, palpitations, edema.  GASTROINTESTINAL: Denies nausea, vomiting, diarrhea, abdominal pain. Denies bright red blood per rectum. GENITOURINARY: Denies dysuria, hematuria. ENDOCRINE: Denies nocturia or thyroid problems. HEMATOLOGIC AND LYMPHATIC: Denies easy bruising or bleeding. SKIN: Denies rash or  lesion. MUSCULOSKELETAL: Denies pain in neck, back, shoulder, knees, hips or arthritic symptoms.  NEUROLOGIC: Denies paralysis, paresthesias.  PSYCHIATRIC: Denies anxiety or depressive symptoms.   VITAL SIGNS:  Blood pressure 135/66, pulse 75, temperature 98.4 F (36.9 C), temperature source Oral, resp. rate 18, SpO2 100 %.  I/O:    Intake/Output Summary (Last 24 hours) at 05/12/2018 1152 Last data filed at 05/12/2018 1024 Gross per 24 hour  Intake 552.05 ml  Output 2350 ml  Net -1797.95 ml    PHYSICAL EXAMINATION:  GENERAL:  62 y.o.-year-old patient lying in the bed with no acute distress.  EYES: Pupils equal, round, reactive to light and accommodation. No scleral icterus. Extraocular muscles intact.  HEENT: Head atraumatic, normocephalic. Oropharynx and nasopharynx  clear.  NECK:  Supple, no jugular venous distention. No thyroid enlargement, no tenderness.  LUNGS: Normal breath sounds bilaterally, no wheezing, rales,rhonchi or crepitation. No use of accessory muscles of respiration.  CARDIOVASCULAR: S1, S2 normal. No murmurs, rubs, or gallops.  ABDOMEN: Soft, non-tender, non-distended. Bowel sounds present. No organomegaly or mass.  EXTREMITIES: No pedal edema, cyanosis, or clubbing.  NEUROLOGIC: Cranial nerves II through XII are intact. Muscle strength 5/5 in all extremities. Sensation intact. Gait not checked.  PSYCHIATRIC: The patient is alert and oriented x 3.  SKIN: No obvious rash, lesion, or ulcer.   DATA REVIEW:   CBC Recent Labs  Lab 05/11/18 0202  05/12/18 0652  WBC 8.9  --   --   HGB 9.7*   < > 10.2*  HCT 28.4*  --   --   PLT 258  --   --    < > = values in this interval not displayed.    Chemistries  Recent Labs  Lab 05/10/18 2047 05/11/18 0202  NA 132* 135  K 4.6 4.4  CL 97* 102  CO2 29 27  GLUCOSE 476* 418*  BUN 32* 30*  CREATININE 1.71* 1.45*  CALCIUM 8.6* 8.2*  AST 22  --   ALT 24  --   ALKPHOS 72  --   BILITOT 0.4  --     Cardiac  Enzymes No results for input(s): TROPONINI in the last 168 hours.  Microbiology Results  Results for orders placed or performed in visit on 04/13/18  Microscopic Examination     Status: Abnormal   Collection Time: 04/13/18  2:47 PM  Result Value Ref Range Status   WBC, UA 0-5 0 - 5 /hpf Final   RBC, UA 0-2 0 - 2 /hpf Final   Epithelial Cells (non renal) 0-10 0 - 10 /hpf Final   Casts Present (A) None seen /lpf Final   Cast Type Hyaline casts N/A Final   Mucus, UA Present (A) Not Estab. Final   Bacteria, UA Few (A) None seen/Few Final    RADIOLOGY:  No results found.  EKG:   Orders placed or performed during the hospital encounter of 05/10/18  . EKG 12-Lead  . EKG 12-Lead      Management plans discussed with the patient, family and they are in agreement.  CODE STATUS:     Code Status Orders  (From admission, onward)        Start     Ordered   05/10/18 1841  Do not attempt resuscitation (DNR)  Continuous    Question Answer Comment  In the event of cardiac or respiratory ARREST Do not call a "code blue"   In the event of cardiac or respiratory ARREST Do not perform Intubation, CPR, defibrillation or ACLS   In the event of cardiac or respiratory ARREST Use medication by any route, position, wound care, and other measures to relive pain and suffering. May use oxygen, suction and manual treatment of airway obstruction as needed for comfort.      05/10/18 1843    Code Status History    This patient has a current code status but no historical code status.    Advance Directive Documentation     Most Recent Value  Type of Advance Directive  Healthcare Power of Attorney, Living will  Pre-existing out of facility DNR order (yellow form or pink MOST form)  -  "MOST" Form in Place?  -      [place]  OF THIS PATIENT:43 minutes.   Note: This dictation was prepared with Dragon dictation along with smaller phrase technology. Any transcriptional errors that  result from this process are unintentional.   @[facility] @  on 05/12/2018 at 11:52 AM  Between 7am to 6pm - Pager - [phone]  After 6pm go to [email or web address] - password [email or web address] [facility]  Office  [phone]  CC: Primary care physician; Michael Aus, MD

## 2018-05-12 NOTE — Progress Notes (Signed)
Pt refusing blood work this morning for heparin gtt

## 2018-05-12 NOTE — Progress Notes (Signed)
Pt discharge orders in med reconciliation not completed so cannot print AVS for patient. MD has been paged with no response. 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 This RN did provide pt with follow up appointment information. Pt very disagreeable and unsatisfied with his stay, insisting that he see his primary doctor here in the hospital.

## 2018-05-13 ENCOUNTER — Ambulatory Visit (INDEPENDENT_AMBULATORY_CARE_PROVIDER_SITE_OTHER): Payer: Medicare Other | Admitting: Vascular Surgery

## 2018-05-17 ENCOUNTER — Ambulatory Visit (INDEPENDENT_AMBULATORY_CARE_PROVIDER_SITE_OTHER): Payer: Self-pay | Admitting: Vascular Surgery

## 2018-05-19 ENCOUNTER — Ambulatory Visit (INDEPENDENT_AMBULATORY_CARE_PROVIDER_SITE_OTHER): Payer: Medicare Other | Admitting: Vascular Surgery

## 2018-05-19 ENCOUNTER — Encounter (INDEPENDENT_AMBULATORY_CARE_PROVIDER_SITE_OTHER): Payer: Self-pay | Admitting: Vascular Surgery

## 2018-05-19 VITALS — BP 142/79 | HR 89 | Resp 16 | Ht 67.0 in | Wt 197.8 lb

## 2018-05-19 DIAGNOSIS — R600 Localized edema: Secondary | ICD-10-CM

## 2018-05-19 DIAGNOSIS — I70213 Atherosclerosis of native arteries of extremities with intermittent claudication, bilateral legs: Secondary | ICD-10-CM | POA: Diagnosis not present

## 2018-05-19 DIAGNOSIS — K559 Vascular disorder of intestine, unspecified: Secondary | ICD-10-CM | POA: Diagnosis not present

## 2018-05-19 NOTE — Progress Notes (Signed)
Subjective:    Patient ID: Michael Patrick, male    DOB: [birth year], 62 y.o.   MRN: [id number] Chief Complaint  Patient presents with  . Follow-up    [facility] 1wk    Patient presents for his first post procedure follow-up.  The patient is status post a mesenteric angiogram on May 11, 2018 with PTA of the SMA.  The patient notes that he has experienced some diarrhea since the procedure.  Denies any abdominal pain, nausea, vomiting or fever.  The patient will return to the clinic in approximately 1 month and undergo a mesenteric duplex to assess the patency of his SMA.  The patient is asking to have the arterial status of his bilateral lower extremities assessed.  The patient notes bilateral leg pain with activity as well as when they are elevated.  Denies rest pain or ulcer formation to the bilateral lower extremity.  Will also bring the patient back in 1 month and have him to go a bilateral ABI to assess his arterial patency.  The patient states he is also experiencing bilateral lower extremity edema.  At this time, the patient does not engage in conservative therapy including wearing medical grade 1 compression socks, elevation and remaining active on a daily basis.  I will bring the patient back after a trial.  As per insurance requirements of 3 months engaging in conservative therapy and undergo a bilateral lower extremity venous duplex to rule out any venous versus lymphatic disease. The patient notes that he experiences pain to the bilateral feet.  The patient states that he has been diagnosed with neuropathy however he feels that it is not because gabapentin does not work for him.  The patient notes that Dr. Ubaldo Glassing follows his carotid artery stenosis.  Review of Systems  Constitutional: Negative.   HENT: Negative.   Eyes: Negative.   Respiratory: Negative.   Cardiovascular:       Lower extremity pain Mesenteric ischemia Lower extremity edema  Gastrointestinal: Negative.   Endocrine: Negative.    Genitourinary: Negative.   Musculoskeletal: Negative.   Skin: Negative.   Allergic/Immunologic: Negative.   Neurological: Negative.   Hematological: Negative.   Psychiatric/Behavioral: Negative.       Objective:   Physical Exam  Constitutional: He is oriented to person, place, and time. He appears well-developed and well-nourished.  HENT:  Head: Normocephalic and atraumatic.  Right Ear: External ear normal.  Left Ear: External ear normal.  Eyes: Pupils are equal, round, and reactive to light. Conjunctivae and EOM are normal.  Neck: Normal range of motion.  Cardiovascular: Normal rate.  Pulmonary/Chest: Effort normal.  Musculoskeletal: Normal range of motion. He exhibits edema (Right lower extremity 1+ pitting mild edema.  Left lower extremity 1+ mild pitting edema.).  Neurological: He is alert and oriented to person, place, and time.  Skin: Skin is warm and dry.  Psychiatric: He has a normal mood and affect. His behavior is normal. Judgment and thought content normal.  Vitals reviewed.  BP (!) 142/79 (BP Location: Right Arm)   Pulse 89   Resp 16   Ht 5\' 7"  (1.702 m)   Wt 197 lb 12.8 oz (89.7 kg)   BMI 30.98 kg/m   Past Medical History:  Diagnosis Date  . Carotid artery occlusion   . CHF (congestive heart failure) ([place])   . Diabetes mellitus without complication ([place])   . Hyperlipidemia   . Hypertension   . Stroke [facility])    Social History   Socioeconomic  History  . Marital status: Married    Spouse name: Not on file  . Number of children: Not on file  . Years of education: Not on file  . Highest education level: Not on file  Occupational History  . Not on file  Social Needs  . Financial resource strain: Not on file  . Food insecurity:    Worry: Not on file    Inability: Not on file  . Transportation needs:    Medical: Not on file    Non-medical: Not on file  Tobacco Use  . Smoking status: Former Research scientist (life sciences)  . Smokeless tobacco: Never Used  Substance and  Sexual Activity  . Alcohol use: Yes    Comment: occassionally  . Drug use: No  . Sexual activity: Not on file  Lifestyle  . Physical activity:    Days per week: Not on file    Minutes per session: Not on file  . Stress: Not on file  Relationships  . Social connections:    Talks on phone: Not on file    Gets together: Not on file    Attends religious service: Not on file    Active member of club or organization: Not on file    Attends meetings of clubs or organizations: Not on file    Relationship status: Not on file  . Intimate partner violence:    Fear of current or ex partner: Not on file    Emotionally abused: Not on file    Physically abused: Not on file    Forced sexual activity: Not on file  Other Topics Concern  . Not on file  Social History Narrative  . Not on file   Past Surgical History:  Procedure Laterality Date  . AORTIC VALVE REPLACEMENT    . COLONOSCOPY WITH PROPOFOL N/A 05/04/2018   Procedure: COLONOSCOPY WITH PROPOFOL;  Surgeon: Lollie Sails, MD;  Location: [facility] ENDOSCOPY;  Service: Endoscopy;  Laterality: N/A;  . CORONARY STENT PLACEMENT    . defibrillator/pacemaker     1992 Defib 4 yrs ago.Marland KitchenMarland KitchenMurmur/heart failure . during valve placement electrical component malfunction  . ESOPHAGOGASTRODUODENOSCOPY (EGD) WITH PROPOFOL N/A 05/04/2018   Procedure: ESOPHAGOGASTRODUODENOSCOPY (EGD) WITH PROPOFOL;  Surgeon: Lollie Sails, MD;  Location: [facility] ENDOSCOPY;  Service: Endoscopy;  Laterality: N/A;  . HIP SURGERY    . VISCERAL ARTERY INTERVENTION N/A 05/11/2018   Procedure: VISCERAL ARTERY INTERVENTION;  Surgeon: Katha Cabal, MD;  Location: [place] CV LAB;  Service: Cardiovascular;  Laterality: N/A;   Family History  Problem Relation Age of Onset  . Hypertension Mother   . Hypertension Father    Allergies  Allergen Reactions  . Insulins Other (See Comments)      Assessment & Plan:  Patient presents for his first post procedure follow-up.   The patient is status post a mesenteric angiogram on May 11, 2018 with PTA of the SMA.  The patient notes that he has experienced some diarrhea since the procedure.  Denies any abdominal pain, nausea, vomiting or fever.  The patient will return to the clinic in approximately 1 month and undergo a mesenteric duplex to assess the patency of his SMA.  The patient is asking to have the arterial status of his bilateral lower extremities assessed.  The patient notes bilateral leg pain with activity as well as when they are elevated.  Denies rest pain or ulcer formation to the bilateral lower extremity.  Will also bring the patient back in 1 month and have  him to go a bilateral ABI to assess his arterial patency.  The patient states he is also experiencing bilateral lower extremity edema.  At this time, the patient does not engage in conservative therapy including wearing medical grade 1 compression socks, elevation and remaining active on a daily basis.  I will bring the patient back after a trial.  As per insurance requirements of 3 months engaging in conservative therapy and undergo a bilateral lower extremity venous duplex to rule out any venous versus lymphatic disease. The patient notes that he experiences pain to the bilateral feet.  The patient states that he has been diagnosed with neuropathy however he feels that it is not because gabapentin does not work for him.  The patient notes that Dr. Ubaldo Glassing follows his carotid artery stenosis.  1. Ischemic colitis [facility]) - New Patient is status post a mesenteric angiogram with angioplasty of the SMA for ischemic colitis With the exception of some diarrhea the patient sounds to be asymptomatic at this time. I will bring the patient back in approximately 1 month to start a series of follow-ups The patient will undergo a mesenteric duplex at 1 month, 3 months, 6 months and then every year  2. Atherosclerosis of native artery of both lower extremities with intermittent  claudication ([place]) - New Patient with multiple risk factors for peripheral artery disease Patient with claudication-like symptoms and pain when his lower extremities elevated We will bring the patient back when he returns for his first 1 month mesenteric duplex and have him undergo an ABI to assess for any contributing peripheral artery disease  3. Bilateral lower extremity edema - New Had a long discussion with the patient about insurance requirements of a trial of 3 months of conservative therapy including wearing medical grade 1 compression socks, elevating his legs and remaining active on a daily basis. The patient seemed to be upset about having to purchase a pair of compression socks quoting how can insurance require compression socks if they do not pay for them". I offered to give the patient the information to elastic therapy in  where they sell less than perfect socks at $5, $10 and $15.  He refused. The patient also noted that he did not want to wear his socks because it is the summer and it is hot and he would be on the boat. I explained that he needed to prove to insurance that he at least tried conservative therapy and it failed for [place] to be able to move forward with any additional therapies to help control his edema. The patient was given a prescription for 20 to 72mmhg calf high compression socks. I will bring the patient back in 3 months to undergo bilateral lower extremity venous duplex to rule out any contributing venous versus lymphatic disease. At the 70-month follow-up hopefully the patient has started to wear his compression socks and I can gauge how he is doing with them.  Current Outpatient Medications on File Prior to Visit  Medication Sig Dispense Refill  . acetaminophen (TYLENOL) 325 MG tablet Take 2 tablets (650 mg total) by mouth every 6 (six) hours as needed for mild pain (or Fever >/= 101).    Marland Kitchen aspirin EC 81 MG tablet Take 81 mg by mouth at bedtime.     Marland Kitchen  atorvastatin (LIPITOR) 10 MG tablet Take 1 tablet (10 mg total) by mouth daily at 6 PM. 30 tablet 0  . carvedilol (COREG) 12.5 MG tablet     . Cinnamon  Bark POWD by Does not apply route.    . clopidogrel (PLAVIX) 75 MG tablet TAKE ONE TABLET BY MOUTH EVERY MORNING    . Cyanocobalamin (VITAMIN B-12) 5000 MCG SUBL Place under the tongue.    . diazepam (VALIUM) 5 MG tablet Take 5 mg by mouth 2 (two) times daily.     . Dulaglutide 0.75 MG/0.5ML SOPN Inject into the skin.    . Exenatide ER (BYDUREON) 2 MG PEN Inject into the skin.    . finasteride (PROSCAR) 5 MG tablet TAKE ONE TABLET BY MOUTH EVERY DAY    . gabapentin (NEURONTIN) 800 MG tablet Take 600 mg by mouth 4 (four) times daily.     Marland Kitchen glimepiride (AMARYL) 4 MG tablet Take 4 mg by mouth 2 (two) times daily.     Marland Kitchen lisinopril (PRINIVIL,ZESTRIL) 40 MG tablet TAKE ONE TABLET EVERY DAY    . magnesium oxide (MAG-OX) 400 MG tablet Take 400 mg by mouth daily. Takes 200 mg in AM and 200 mg at bedtime    . metFORMIN (GLUCOPHAGE-XR) 500 MG 24 hr tablet 4 TABLETS BY MOUTH AT BEDTIME    . niacin 250 MG tablet Take by mouth.    . nitroGLYCERIN (NITROSTAT) 0.4 MG SL tablet PLACE 1 TABLET UNDER TONGUE EVERY 5 MIN AS NEEDED FOR CHEST PAIN IF NO RELIEF IN15 MIN CALL 911 (MAX 3 TABS)    . ondansetron (ZOFRAN) 4 MG tablet Take by mouth.    . oxyCODONE-acetaminophen (PERCOCET/ROXICET) 5-325 MG tablet     . pantoprazole (PROTONIX) 40 MG tablet Take 40 mg by mouth daily.  2  . Polyethyl Glycol-Propyl Glycol 0.4-0.3 % SOLN Apply to eye.    . promethazine (PHENERGAN) 25 MG tablet Take by mouth.    . spironolactone (ALDACTONE) 25 MG tablet     . TIKOSYN 500 MCG capsule     . topiramate (TOPAMAX) 25 MG tablet TAKE 3 TABLETS (75 MG TOTAL) BY MOUTH 2 (TWO) TIMES DAILY  1  . warfarin (COUMADIN) 1 MG tablet TAKE ONE TABLET AT BEDTIME    . warfarin (COUMADIN) 6 MG tablet     . furosemide (LASIX) 40 MG tablet Take by mouth.    . OXYCONTIN 20 MG 12 hr tablet      No  current facility-administered medications on file prior to visit.    There are no Patient Instructions on file for this visit. No follow-ups on file.  Deshay Blumenfeld A Faithanne Verret, PA-C

## 2018-05-21 ENCOUNTER — Ambulatory Visit: Payer: Self-pay | Admitting: Urology

## 2018-05-25 ENCOUNTER — Other Ambulatory Visit: Payer: Self-pay | Admitting: Urology

## 2018-06-04 ENCOUNTER — Other Ambulatory Visit: Payer: Self-pay

## 2018-06-04 ENCOUNTER — Encounter: Payer: Self-pay | Admitting: Urology

## 2018-06-04 ENCOUNTER — Ambulatory Visit (INDEPENDENT_AMBULATORY_CARE_PROVIDER_SITE_OTHER): Payer: Medicare Other | Admitting: Urology

## 2018-06-04 ENCOUNTER — Other Ambulatory Visit
Admission: RE | Admit: 2018-06-04 | Discharge: 2018-06-04 | Disposition: A | Discharge status: Home / Self Care | Payer: Medicare Other | Source: Ambulatory Visit | Attending: Urology | Admitting: Urology

## 2018-06-04 VITALS — BP 129/71 | HR 93 | Ht 67.0 in | Wt 190.0 lb

## 2018-06-04 DIAGNOSIS — N329 Bladder disorder, unspecified: Secondary | ICD-10-CM

## 2018-06-04 DIAGNOSIS — N200 Calculus of kidney: Secondary | ICD-10-CM

## 2018-06-04 LAB — URINALYSIS, COMPLETE (UACMP) WITH MICROSCOPIC
BACTERIA UA: NONE SEEN
Bilirubin Urine: NEGATIVE
Ketones, ur: NEGATIVE mg/dL
Leukocytes, UA: NEGATIVE
Nitrite: NEGATIVE
PH: 5 (ref 5.0–8.0)
PROTEIN: NEGATIVE mg/dL
SPECIFIC GRAVITY, URINE: 1.015 (ref 1.005–1.030)

## 2018-06-04 MED ORDER — LIDOCAINE HCL URETHRAL/MUCOSAL 2 % EX GEL
1.0000 "application " | Freq: Once | CUTANEOUS | Status: AC
  Administered 2018-06-04: 1 via URETHRAL

## 2018-06-04 NOTE — Progress Notes (Signed)
   06/04/18  CC:  Chief Complaint  Patient presents with  . Cysto    HPI: 62 year old male who returns the office today for repeat cystoscopy after cystoscopy on 04/13/2018 showed an irregular lesion on the right bladder wall suspicious for possible focal inflammation versus cystitis.  Notably, he underwent CT urogram earlier today which showed bilateral nonobstructing stones, largest measuring 9 mm on the left.  Otherwise, his urogram was unremarkable.  His bladder was poorly visualized due to artifact from his bilateral hips.  No filling defects appreciated although there was some incomplete filling of the left mid ureter secondary to contrast bolus timing.  He is asymptomatic from the stone. No personal history of kidney stones. No flank pain.  No gross hematuria.   Blood pressure 129/71, pulse 93, height 5\' 7"  (1.702 m), weight 190 lb (86.2 kg). NED. A&Ox3.   No respiratory distress   Abd soft, NT, ND Normal phallus with bilateral descended testicles  Cystoscopy Procedure Note  Patient identification was confirmed, informed consent was obtained, and patient was prepped using Betadine solution.  Lidocaine jelly was administered per urethral meatus.    Preoperative abx where received prior to procedure.     Pre-Procedure: - Inspection reveals a normal caliber ureteral meatus.  Procedure: The flexible cystoscope was introduced without difficulty - No urethral strictures/lesions are present. - Normal prostate  - Normal bladder neck - Bilateral ureteral orifices identified - Bladder mucosa  reveals no ulcers, tumors, or lesions - No bladder stones - No trabeculation  Retroflexion shows unremarkable   Post-Procedure: - Patient tolerated the procedure well  Assessment/ Plan:  1. Lesion of bladder Cystoscopy today is unremarkable Suspect previously notable cobblestone inflammatory lesion was reactive/inflammatory rather than underlying malignancy - lidocaine  (XYLOCAINE) 2 % jelly 1 application - DG Abd 1 View; Future  2. Kidney stones Multiple bilateral nonobstructing stones, asymptomatic We discussed options for this which primarily include ureteroscopy given the lesions are multifocal He has had multiple issues, primarily bowel over the past several months and at this point, he would like to defer any elective procedure for the time being which is quite reasonable We will plan on follow-up in 6 months with KUB Warning symptoms reviewed   Return in about 6 months (around 12/05/2018) for KUB.

## 2018-07-15 DIAGNOSIS — I739 Peripheral vascular disease, unspecified: Secondary | ICD-10-CM | POA: Insufficient documentation

## 2018-08-25 DIAGNOSIS — M7989 Other specified soft tissue disorders: Secondary | ICD-10-CM | POA: Insufficient documentation

## 2018-09-20 ENCOUNTER — Ambulatory Visit (INDEPENDENT_AMBULATORY_CARE_PROVIDER_SITE_OTHER): Payer: Self-pay | Admitting: Vascular Surgery

## 2018-09-20 ENCOUNTER — Encounter (INDEPENDENT_AMBULATORY_CARE_PROVIDER_SITE_OTHER): Payer: Self-pay

## 2018-10-21 ENCOUNTER — Encounter

## 2018-10-21 ENCOUNTER — Encounter (INDEPENDENT_AMBULATORY_CARE_PROVIDER_SITE_OTHER): Payer: Self-pay

## 2018-10-21 ENCOUNTER — Ambulatory Visit (INDEPENDENT_AMBULATORY_CARE_PROVIDER_SITE_OTHER): Payer: Self-pay | Admitting: Vascular Surgery

## 2018-12-10 ENCOUNTER — Ambulatory Visit (INDEPENDENT_AMBULATORY_CARE_PROVIDER_SITE_OTHER): Payer: Medicare Other | Admitting: Urology

## 2018-12-10 ENCOUNTER — Encounter: Payer: Self-pay | Admitting: Urology

## 2018-12-10 ENCOUNTER — Ambulatory Visit
Admission: RE | Admit: 2018-12-10 | Discharge: 2018-12-10 | Disposition: A | Discharge status: Home / Self Care | Payer: Medicare Other | Source: Ambulatory Visit | Attending: Urology | Admitting: Urology

## 2018-12-10 ENCOUNTER — Ambulatory Visit
Admission: RE | Admit: 2018-12-10 | Discharge: 2018-12-10 | Disposition: A | Discharge status: Home / Self Care | Payer: Medicare Other | Attending: Urology | Admitting: Urology

## 2018-12-10 VITALS — BP 127/78 | HR 98 | Ht 67.0 in | Wt 185.0 lb

## 2018-12-10 DIAGNOSIS — N329 Bladder disorder, unspecified: Secondary | ICD-10-CM | POA: Diagnosis not present

## 2018-12-10 DIAGNOSIS — N200 Calculus of kidney: Secondary | ICD-10-CM | POA: Diagnosis not present

## 2018-12-10 DIAGNOSIS — I359 Nonrheumatic aortic valve disorder, unspecified: Secondary | ICD-10-CM | POA: Insufficient documentation

## 2018-12-10 NOTE — Progress Notes (Signed)
12/10/2018 2:35 PM   TEMITAYO COVALT [birth year] [id number]  Referring provider: Rusty Aus, MD [facility] [place], [place] [address]  Chief Complaint  Patient presents with  . Nephrolithiasis    6 month w/KUB    HPI: 63 yo M with personal history of nephrolithiasis and history of bladder lesion who returns today for routine 9-month follow-up and surveillance of his kidney stones.  He underwent evaluation for a suspicious lesion in the bladder which ultimately resolved.  His urine cytology was negative.  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  His urinary symptoms are now well controlled.  He denies any dysuria urgency frequency or any gross hematuria.  He does have personal history of kidney stones incidentally identified on CT urogram.  He initially was considering treatment but given his recent medical setbacks, is less interested in this.  He denies any flank pain.  He has no previous stone history.  KUB today shows 2 stable approximate 1 cm left renal stones, unchanged compared to previous CT scan.   PMH: Past Medical History:  Diagnosis Date  . Carotid artery occlusion   . CHF (congestive heart failure) ([place])   . Diabetes mellitus without complication ([place])   . Hyperlipidemia   . Hypertension   . Stroke [facility])     Surgical History: Past Surgical History:  Procedure Laterality Date  . AORTIC VALVE REPLACEMENT    . COLONOSCOPY WITH PROPOFOL N/A 05/04/2018   Procedure: COLONOSCOPY WITH PROPOFOL;  Surgeon: Lollie Sails, MD;  Location: [facility] ENDOSCOPY;  Service: Endoscopy;  Laterality: N/A;  . CORONARY STENT PLACEMENT    . defibrillator/pacemaker     1992 Defib 4 yrs ago.Marland KitchenMarland KitchenMurmur/heart failure . during valve placement electrical component malfunction  . ESOPHAGOGASTRODUODENOSCOPY (EGD) WITH PROPOFOL N/A 05/04/2018   Procedure: ESOPHAGOGASTRODUODENOSCOPY (EGD) WITH PROPOFOL;  Surgeon: Lollie Sails, MD;  Location: [facility] ENDOSCOPY;  Service: Endoscopy;  Laterality: N/A;  . HIP SURGERY    . VISCERAL ARTERY INTERVENTION N/A 05/11/2018   Procedure: VISCERAL ARTERY INTERVENTION;  Surgeon: Katha Cabal, MD;  Location: [place] CV LAB;  Service: Cardiovascular;  Laterality: N/A;    Home Medications:  Allergies as of 12/10/2018      Reactions   Insulins Other (See Comments)      Medication List       Accurate as of December 10, 2018  2:35 PM. Always use your most recent med list.        acetaminophen 325 MG tablet Commonly known as:  TYLENOL Take 2 tablets (650 mg total) by mouth every 6 (six) hours as needed for mild pain (or Fever >/= 101).   aspirin EC 81 MG tablet Take 81 mg by mouth at bedtime.   atorvastatin 10 MG tablet Commonly known as:  LIPITOR Take 1 tablet (10 mg total) by mouth daily at 6 PM.   carvedilol 12.5 MG tablet Commonly known as:  COREG   Cinnamon Bark Powd by Does not apply route.   clopidogrel 75 MG tablet Commonly known as:  PLAVIX TAKE ONE TABLET BY MOUTH EVERY MORNING   diazepam 5 MG tablet Commonly known as:  VALIUM Take 5 mg by mouth 2 (two) times daily.   finasteride 5 MG tablet Commonly known as:  PROSCAR TAKE ONE TABLET BY MOUTH EVERY DAY   furosemide 40 MG tablet Commonly known as:  LASIX Take by  mouth.   gabapentin 800 MG tablet Commonly known as:  NEURONTIN Take 600 mg by mouth 4 (four) times daily.   glimepiride 4 MG tablet Commonly known as:  AMARYL Take 4 mg by mouth 2 (two) times daily.   lisinopril 40 MG tablet Commonly known as:  PRINIVIL,ZESTRIL TAKE ONE TABLET EVERY DAY   magnesium oxide 400 MG tablet Commonly known as:  MAG-OX Take 400 mg by mouth daily. Takes 200 mg in AM and 200 mg at bedtime   niacin 250 MG tablet Take by mouth.   NITROSTAT 0.4 MG SL tablet Generic drug:  nitroGLYCERIN PLACE 1 TABLET UNDER  TONGUE EVERY 5 MIN AS NEEDED FOR CHEST PAIN IF NO RELIEF IN15 MIN CALL 911 (MAX 3 TABS)   ondansetron 4 MG tablet Commonly known as:  ZOFRAN Take by mouth.   oxyCODONE-acetaminophen 5-325 MG tablet Commonly known as:  PERCOCET/ROXICET   pantoprazole 40 MG tablet Commonly known as:  PROTONIX Take 40 mg by mouth daily.   Polyethyl Glycol-Propyl Glycol 0.4-0.3 % Soln Apply to eye.   spironolactone 25 MG tablet Commonly known as:  ALDACTONE   TIKOSYN 500 MCG capsule Generic drug:  dofetilide   Vitamin B-12 5000 MCG Subl Place under the tongue.   warfarin 1 MG tablet Commonly known as:  COUMADIN TAKE ONE TABLET AT BEDTIME   warfarin 6 MG tablet Commonly known as:  COUMADIN       Allergies:  Allergies  Allergen Reactions  . Insulins Other (See Comments)    Family History: Family History  Problem Relation Age of Onset  . Hypertension Mother   . Hypertension Father     Social History:  reports that he has quit smoking. He has never used smokeless tobacco. He reports current alcohol use. He reports that he does not use drugs.  ROS: UROLOGY Frequent Urination?: No Hard to postpone urination?: No Burning/pain with urination?: No Get up at night to urinate?: No Leakage of urine?: No Urine stream starts and stops?: No Trouble starting stream?: No Do you have to strain to urinate?: No Blood in urine?: No Urinary tract infection?: No Sexually transmitted disease?: No Injury to kidneys or bladder?: No Painful intercourse?: No Weak stream?: No Erection problems?: No Penile pain?: No  Gastrointestinal Nausea?: No Vomiting?: No Indigestion/heartburn?: No Diarrhea?: No Constipation?: No  Constitutional Fever: No Night sweats?: No Weight loss?: No Fatigue?: No  Skin Skin rash/lesions?: Yes Itching?: Yes  Eyes Blurred vision?: No Double vision?: No  Ears/Nose/Throat Sore throat?: No Sinus problems?: No  Hematologic/Lymphatic Swollen glands?:  No Easy bruising?: No  Cardiovascular Leg swelling?: No Chest pain?: No  Respiratory Cough?: No Shortness of breath?: No  Endocrine Excessive thirst?: No  Musculoskeletal Back pain?: Yes Joint pain?: No  Neurological Headaches?: No Dizziness?: No  Psychologic Depression?: No Anxiety?: No  Physical Exam: BP 127/78   Pulse 98   Ht 5\' 7"  (1.702 m)   Wt 185 lb (83.9 kg)   BMI 28.98 kg/m   Constitutional:  Alert and oriented, No acute distress.  HEENT: [place] AT, moist mucus membranes.  Trachea midline, no masses. Cardiovascular: No clubbing, cyanosis, or edema. Respiratory: Normal respiratory effort, no increased work of breathing. GI: Abdomen is soft, nontender, nondistended, no abdominal masses GU: No CVA tenderness Skin: Large facial hematoma surrounding right eye. Neurologic: Grossly intact, no focal deficits, moving all 4 extremities. Psychiatric: Normal mood and affect.  Laboratory Data: Lab Results  Component Value Date   WBC 8.9 05/11/2018  HGB 10.2 (L) 05/12/2018   HCT 28.4 (L) 05/11/2018   MCV 90.5 05/11/2018   PLT 258 05/11/2018    Lab Results  Component Value Date   CREATININE 1.45 (H) 05/11/2018    Urinalysis    Component Value Date/Time   COLORURINE YELLOW 06/04/2018 1525   APPEARANCEUR CLEAR 06/04/2018 1525   APPEARANCEUR Clear 04/13/2018 1447   LABSPEC 1.015 06/04/2018 1525   PHURINE 5.0 06/04/2018 1525   GLUCOSEU >1000 (A) 06/04/2018 1525   HGBUR TRACE (A) 06/04/2018 1525   BILIRUBINUR NEGATIVE 06/04/2018 1525   BILIRUBINUR Negative 04/13/2018 1447   KETONESUR NEGATIVE 06/04/2018 1525   PROTEINUR NEGATIVE 06/04/2018 1525   NITRITE NEGATIVE 06/04/2018 1525   LEUKOCYTESUR NEGATIVE 06/04/2018 1525   LEUKOCYTESUR Negative 04/13/2018 1447    Lab Results  Component Value Date   LABMICR See below: 04/13/2018   WBCUA 0-5 04/13/2018   RBCUA 0-2 04/13/2018   LABEPIT 0-10 04/13/2018   MUCUS Present (A) 04/13/2018   BACTERIA NONE SEEN  06/04/2018    Pertinent Imaging: CLINICAL DATA:  Bilateral kidney stones.  Follow-up exam.  EXAM: ABDOMEN - 1 VIEW  COMPARISON:  CT, 04/13/2018.  FINDINGS: Two prominent stones project in the lower pole of the left kidney, largest measuring 1 cm, stable the prior CT. There are 2 small stones projecting in the upper pole the left kidney, which consistent with vascular calcifications noted on the prior CT. There several small stones projecting in the lower pole the right kidney, also unchanged.  No evidence of a ureteral stone.  Normal bowel gas pattern.  There are scattered aortic iliac and femoral artery vascular calcifications.  Benign bone island in the right ilium adjacent to the SI joint, stable.  Bilateral total hip arthroplasties are incompletely imaged, but appear well seated and aligned.  IMPRESSION: 1. No acute findings.  No evidence of a ureteral stone. 2. Stable bilateral intrarenal stones as detailed.   Electronically Signed   By: Lajean Manes M.D.   On: 12/11/2018 09:20  KUB today personally reviewed.  This is stable from his previous CT scan on 04/2018, stones unchanged.  Assessment & Plan:    1. Kidney stones Remains asymptomatic from his relatively large nonobstructing stones, left greater than right These were incidentally discovered on CT urogram 04/2018, without history of flank pain or previous stone episodes In light of most recent medical issues including stroke and ischemic colitis, very hesitant to take him to the operating room for an elective procedure in the absence of symptoms He understands that the stones are relatively large in size (1 cm each) and if they in fact move, he may require urgent surgical intervention to address these Timeline of the stones including duration of the stone burden unclear, stable over the last 6+ months Recommend continued conservative management for the time being Reassess in 1 year with KUB or  sooner if he develops any flank pain or urinary symptoms He is agreeable this plan   Return in about 1 year (around 12/11/2019) for KUB.  Hollice Espy, MD  [facility] [address] [place], [place] [address] [phone]

## 2019-04-27 ENCOUNTER — Telehealth: Payer: Self-pay | Admitting: Urology

## 2019-04-27 NOTE — Telephone Encounter (Signed)
Spoke to patient and he states he is currently using percocet for the pain. I offered for him to schedule and appointment with  Dr. Diamantina Providence tomorrow but he states he doesn't think he will make it. He said he will need to go to the ER.

## 2019-04-27 NOTE — Telephone Encounter (Signed)
Pt called and states that he is having low back and abdominal pain for about 6 hours. Please advise.

## 2019-04-28 ENCOUNTER — Ambulatory Visit
Admission: RE | Admit: 2019-04-28 | Discharge: 2019-04-28 | Disposition: A | Discharge status: Home / Self Care | Payer: Medicare Other | Source: Ambulatory Visit | Attending: Urology | Admitting: Urology

## 2019-04-28 ENCOUNTER — Emergency Department: Payer: Medicare Other

## 2019-04-28 ENCOUNTER — Inpatient Hospital Stay
Admission: EM | Admit: 2019-04-28 | Discharge: 2019-05-04 | DRG: 445 | Disposition: A | Discharge status: Home / Self Care | Payer: Medicare Other | Attending: Internal Medicine | Admitting: Internal Medicine

## 2019-04-28 ENCOUNTER — Ambulatory Visit (INDEPENDENT_AMBULATORY_CARE_PROVIDER_SITE_OTHER): Payer: Medicare Other | Admitting: Urology

## 2019-04-28 ENCOUNTER — Other Ambulatory Visit: Payer: Self-pay

## 2019-04-28 ENCOUNTER — Encounter: Payer: Self-pay | Admitting: Urology

## 2019-04-28 VITALS — BP 128/80 | HR 80 | Ht 67.0 in | Wt 185.0 lb

## 2019-04-28 DIAGNOSIS — R109 Unspecified abdominal pain: Secondary | ICD-10-CM | POA: Diagnosis present

## 2019-04-28 DIAGNOSIS — Z8249 Family history of ischemic heart disease and other diseases of the circulatory system: Secondary | ICD-10-CM | POA: Diagnosis not present

## 2019-04-28 DIAGNOSIS — Z7901 Long term (current) use of anticoagulants: Secondary | ICD-10-CM

## 2019-04-28 DIAGNOSIS — Z7984 Long term (current) use of oral hypoglycemic drugs: Secondary | ICD-10-CM

## 2019-04-28 DIAGNOSIS — Z66 Do not resuscitate: Secondary | ICD-10-CM | POA: Diagnosis not present

## 2019-04-28 DIAGNOSIS — Z515 Encounter for palliative care: Secondary | ICD-10-CM | POA: Diagnosis not present

## 2019-04-28 DIAGNOSIS — I110 Hypertensive heart disease with heart failure: Secondary | ICD-10-CM | POA: Diagnosis present

## 2019-04-28 DIAGNOSIS — E782 Mixed hyperlipidemia: Secondary | ICD-10-CM | POA: Diagnosis present

## 2019-04-28 DIAGNOSIS — K5900 Constipation, unspecified: Secondary | ICD-10-CM | POA: Diagnosis present

## 2019-04-28 DIAGNOSIS — N200 Calculus of kidney: Secondary | ICD-10-CM | POA: Insufficient documentation

## 2019-04-28 DIAGNOSIS — R1084 Generalized abdominal pain: Secondary | ICD-10-CM | POA: Diagnosis not present

## 2019-04-28 DIAGNOSIS — Z952 Presence of prosthetic heart valve: Secondary | ICD-10-CM | POA: Diagnosis not present

## 2019-04-28 DIAGNOSIS — I5022 Chronic systolic (congestive) heart failure: Secondary | ICD-10-CM | POA: Diagnosis present

## 2019-04-28 DIAGNOSIS — I2510 Atherosclerotic heart disease of native coronary artery without angina pectoris: Secondary | ICD-10-CM | POA: Diagnosis present

## 2019-04-28 DIAGNOSIS — Z79899 Other long term (current) drug therapy: Secondary | ICD-10-CM

## 2019-04-28 DIAGNOSIS — Z1159 Encounter for screening for other viral diseases: Secondary | ICD-10-CM | POA: Diagnosis not present

## 2019-04-28 DIAGNOSIS — K810 Acute cholecystitis: Principal | ICD-10-CM | POA: Diagnosis present

## 2019-04-28 DIAGNOSIS — G8929 Other chronic pain: Secondary | ICD-10-CM | POA: Diagnosis present

## 2019-04-28 DIAGNOSIS — D684 Acquired coagulation factor deficiency: Secondary | ICD-10-CM | POA: Diagnosis present

## 2019-04-28 DIAGNOSIS — I4891 Unspecified atrial fibrillation: Secondary | ICD-10-CM | POA: Diagnosis present

## 2019-04-28 DIAGNOSIS — T45515A Adverse effect of anticoagulants, initial encounter: Secondary | ICD-10-CM | POA: Diagnosis present

## 2019-04-28 DIAGNOSIS — N400 Enlarged prostate without lower urinary tract symptoms: Secondary | ICD-10-CM | POA: Diagnosis present

## 2019-04-28 DIAGNOSIS — F17200 Nicotine dependence, unspecified, uncomplicated: Secondary | ICD-10-CM | POA: Diagnosis present

## 2019-04-28 DIAGNOSIS — Z87442 Personal history of urinary calculi: Secondary | ICD-10-CM

## 2019-04-28 DIAGNOSIS — I6529 Occlusion and stenosis of unspecified carotid artery: Secondary | ICD-10-CM | POA: Diagnosis present

## 2019-04-28 DIAGNOSIS — Q441 Other congenital malformations of gallbladder: Secondary | ICD-10-CM

## 2019-04-28 DIAGNOSIS — R0602 Shortness of breath: Secondary | ICD-10-CM

## 2019-04-28 DIAGNOSIS — Z8673 Personal history of transient ischemic attack (TIA), and cerebral infarction without residual deficits: Secondary | ICD-10-CM

## 2019-04-28 DIAGNOSIS — Z79891 Long term (current) use of opiate analgesic: Secondary | ICD-10-CM

## 2019-04-28 DIAGNOSIS — K760 Fatty (change of) liver, not elsewhere classified: Secondary | ICD-10-CM | POA: Diagnosis present

## 2019-04-28 DIAGNOSIS — Z955 Presence of coronary angioplasty implant and graft: Secondary | ICD-10-CM

## 2019-04-28 DIAGNOSIS — E872 Acidosis: Secondary | ICD-10-CM | POA: Diagnosis present

## 2019-04-28 DIAGNOSIS — Z7982 Long term (current) use of aspirin: Secondary | ICD-10-CM

## 2019-04-28 DIAGNOSIS — E119 Type 2 diabetes mellitus without complications: Secondary | ICD-10-CM | POA: Diagnosis present

## 2019-04-28 DIAGNOSIS — E785 Hyperlipidemia, unspecified: Secondary | ICD-10-CM | POA: Diagnosis present

## 2019-04-28 DIAGNOSIS — Z95810 Presence of automatic (implantable) cardiac defibrillator: Secondary | ICD-10-CM

## 2019-04-28 DIAGNOSIS — D72829 Elevated white blood cell count, unspecified: Secondary | ICD-10-CM

## 2019-04-28 DIAGNOSIS — K819 Cholecystitis, unspecified: Secondary | ICD-10-CM

## 2019-04-28 LAB — MICROSCOPIC EXAMINATION: RBC, Urine: >30 /hpf — AB (ref 0–2)

## 2019-04-28 LAB — URINALYSIS, COMPLETE (UACMP) WITH MICROSCOPIC
Bacteria, UA: NONE SEEN
Bilirubin Urine: NEGATIVE
Glucose, UA: >=500 mg/dL — AB
Ketones, ur: NEGATIVE mg/dL
Leukocytes,Ua: NEGATIVE
Nitrite: NEGATIVE
Protein, ur: 100 mg/dL — AB
Specific Gravity, Urine: >1.046 — ABNORMAL HIGH (ref 1.005–1.030)
pH: 5 (ref 5.0–8.0)

## 2019-04-28 LAB — LACTIC ACID, PLASMA
Lactic Acid, Venous: 2 mmol/L (ref 0.5–1.9)
Lactic Acid, Venous: 1.7 mmol/L (ref 0.5–1.9)

## 2019-04-28 LAB — URINALYSIS, COMPLETE
Bilirubin, UA: NEGATIVE
Leukocytes,UA: NEGATIVE
Nitrite, UA: NEGATIVE
Specific Gravity, UA: >1.03 — ABNORMAL HIGH (ref 1.005–1.030)
Urobilinogen, Ur: 0.2 mg/dL (ref 0.2–1.0)
pH, UA: 5 (ref 5.0–7.5)

## 2019-04-28 LAB — TYPE AND SCREEN
ABO/RH(D): A POS
Antibody Screen: NEGATIVE

## 2019-04-28 LAB — COMPREHENSIVE METABOLIC PANEL
ALT: 15 U/L (ref 0–44)
AST: 19 U/L (ref 15–41)
Albumin: 4.1 g/dL (ref 3.5–5.0)
Alkaline Phosphatase: 104 U/L (ref 38–126)
Anion gap: 12 (ref 5–15)
BUN: 19 mg/dL (ref 8–23)
CO2: 26 mmol/L (ref 22–32)
Calcium: 9.7 mg/dL (ref 8.9–10.3)
Chloride: 97 mmol/L — ABNORMAL LOW (ref 98–111)
Creatinine, Ser: 1.06 mg/dL (ref 0.61–1.24)
GFR calc Af Amer: >60 mL/min (ref >60)
GFR calc non Af Amer: >60 mL/min (ref >60)
Glucose, Bld: 318 mg/dL — ABNORMAL HIGH (ref 70–99)
Potassium: 4.8 mmol/L (ref 3.5–5.1)
Sodium: 135 mmol/L (ref 135–145)
Total Bilirubin: 1 mg/dL (ref 0.3–1.2)
Total Protein: 7.9 g/dL (ref 6.5–8.1)

## 2019-04-28 LAB — CBC
HCT: 48.9 % (ref 39.0–52.0)
Hemoglobin: 15.9 g/dL (ref 13.0–17.0)
MCH: 27.6 pg (ref 26.0–34.0)
MCHC: 32.5 g/dL (ref 30.0–36.0)
MCV: 84.9 fL (ref 80.0–100.0)
Platelets: 276 10*3/uL (ref 150–400)
RBC: 5.76 MIL/uL (ref 4.22–5.81)
RDW: 14.4 % (ref 11.5–15.5)
WBC: 19.8 10*3/uL — ABNORMAL HIGH (ref 4.0–10.5)
nRBC: 0 % (ref 0.0–0.2)

## 2019-04-28 LAB — SARS CORONAVIRUS 2 BY RT PCR (HOSPITAL ORDER, PERFORMED IN ~~LOC~~ HOSPITAL LAB): SARS Coronavirus 2: NEGATIVE

## 2019-04-28 LAB — PROTIME-INR
INR: 2.5 — ABNORMAL HIGH (ref 0.8–1.2)
Prothrombin Time: 26.9 seconds — ABNORMAL HIGH (ref 11.4–15.2)

## 2019-04-28 LAB — APTT: aPTT: 53 seconds — ABNORMAL HIGH (ref 24–36)

## 2019-04-28 LAB — LIPASE, BLOOD: Lipase: 23 U/L (ref 11–51)

## 2019-04-28 MED ORDER — GEMFIBROZIL 600 MG PO TABS
600.0000 mg | ORAL_TABLET | Freq: Two times a day (BID) | ORAL | Status: DC
  Administered 2019-04-29 – 2019-05-01 (×5): 600 mg via ORAL
  Filled 2019-04-28 (×8): qty 1

## 2019-04-28 MED ORDER — ACETAMINOPHEN 650 MG RE SUPP: 650.0000 mg | Freq: Four times a day (QID) | RECTAL | Status: DC | PRN

## 2019-04-28 MED ORDER — PIPERACILLIN-TAZOBACTAM 3.375 G IVPB 30 MIN
3.3750 g | Freq: Once | INTRAVENOUS | Status: AC
  Administered 2019-04-28: 3.375 g via INTRAVENOUS
  Filled 2019-04-28: qty 50

## 2019-04-28 MED ORDER — FINASTERIDE 5 MG PO TABS
5.0000 mg | ORAL_TABLET | Freq: Every day | ORAL | Status: DC
  Administered 2019-04-28 – 2019-05-01 (×4): 5 mg via ORAL
  Filled 2019-04-28 (×4): qty 1

## 2019-04-28 MED ORDER — DOFETILIDE 250 MCG PO CAPS
500.0000 ug | ORAL_CAPSULE | Freq: Two times a day (BID) | ORAL | Status: DC
  Administered 2019-04-28 – 2019-05-01 (×7): 500 ug via ORAL
  Filled 2019-04-28 (×9): qty 2

## 2019-04-28 MED ORDER — ACETAMINOPHEN 325 MG PO TABS: 650.0000 mg | ORAL_TABLET | Freq: Four times a day (QID) | ORAL | Status: DC | PRN

## 2019-04-28 MED ORDER — FENTANYL CITRATE (PF) 100 MCG/2ML IJ SOLN
50.0000 ug | Freq: Once | INTRAMUSCULAR | Status: AC
  Administered 2019-04-28: 50 ug via INTRAVENOUS
  Filled 2019-04-28: qty 2

## 2019-04-28 MED ORDER — SODIUM CHLORIDE 0.9% FLUSH
3.0000 mL | Freq: Once | INTRAVENOUS | Status: AC
  Administered 2019-04-28: 3 mL via INTRAVENOUS

## 2019-04-28 MED ORDER — ONDANSETRON HCL 4 MG PO TABS: 4.0000 mg | ORAL_TABLET | Freq: Four times a day (QID) | ORAL | Status: DC | PRN

## 2019-04-28 MED ORDER — PIPERACILLIN-TAZOBACTAM 3.375 G IVPB
3.3750 g | Freq: Three times a day (TID) | INTRAVENOUS | Status: DC
  Administered 2019-04-28 – 2019-05-02 (×11): 3.375 g via INTRAVENOUS
  Filled 2019-04-28 (×10): qty 50

## 2019-04-28 MED ORDER — VANCOMYCIN HCL 10 G IV SOLR
1500.0000 mg | Freq: Once | INTRAVENOUS | Status: AC
  Administered 2019-04-28: 1500 mg via INTRAVENOUS
  Filled 2019-04-28: qty 1500

## 2019-04-28 MED ORDER — HYDROCODONE-ACETAMINOPHEN 7.5-325 MG PO TABS
1.0000 | ORAL_TABLET | Freq: Once | ORAL | Status: AC
  Administered 2019-04-28: 1 via ORAL
  Filled 2019-04-28: qty 1

## 2019-04-28 MED ORDER — ONDANSETRON HCL 4 MG/2ML IJ SOLN
4.0000 mg | Freq: Once | INTRAMUSCULAR | Status: AC
  Administered 2019-04-28: 4 mg via INTRAVENOUS
  Filled 2019-04-28: qty 2

## 2019-04-28 MED ORDER — CARVEDILOL 12.5 MG PO TABS
12.5000 mg | ORAL_TABLET | Freq: Two times a day (BID) | ORAL | Status: DC
  Administered 2019-04-29 – 2019-05-02 (×7): 12.5 mg via ORAL
  Filled 2019-04-28 (×7): qty 1

## 2019-04-28 MED ORDER — ATORVASTATIN CALCIUM 20 MG PO TABS: 80.0000 mg | ORAL_TABLET | Freq: Every day | ORAL | Status: DC

## 2019-04-28 MED ORDER — MORPHINE SULFATE (PF) 2 MG/ML IV SOLN
2.0000 mg | INTRAVENOUS | Status: DC | PRN
  Administered 2019-04-28 – 2019-04-30 (×5): 2 mg via INTRAVENOUS
  Filled 2019-04-28 (×5): qty 1

## 2019-04-28 MED ORDER — IOHEXOL 350 MG/ML SOLN
75.0000 mL | Freq: Once | INTRAVENOUS | Status: AC | PRN
  Administered 2019-04-28: 75 mL via INTRAVENOUS

## 2019-04-28 MED ORDER — SODIUM CHLORIDE 0.9 % IV SOLN
INTRAVENOUS | Status: DC
  Administered 2019-04-28 – 2019-04-30 (×5): via INTRAVENOUS

## 2019-04-28 MED ORDER — HYDROMORPHONE HCL 1 MG/ML IJ SOLN
0.5000 mg | Freq: Once | INTRAMUSCULAR | Status: AC
  Administered 2019-04-28: 0.5 mg via INTRAVENOUS
  Filled 2019-04-28: qty 1

## 2019-04-28 MED ORDER — ONDANSETRON HCL 4 MG/2ML IJ SOLN: 4.0000 mg | Freq: Four times a day (QID) | INTRAMUSCULAR | Status: DC | PRN

## 2019-04-28 NOTE — Telephone Encounter (Signed)
Pt's wife called back and states that the pt is having nausea and severe abdominal and back pain. Please advise.

## 2019-04-28 NOTE — Progress Notes (Signed)
Advanced care plan. Purpose of the Encounter: CODE STATUS Parties in Attendance: Patient Patient's Decision Capacity: Good Subjective/Patient's story: Michael Patrick  is a 63 y.o. male with a known history of aortic valve replacement on Coumadin for anticoagulation, hypertension, hyperlipidemia, CVA, chronic congestive heart failure, type 2 diabetes mellitus, coronary artery disease, cardiac stent placement presented to the emergency room with right upper quadrant pain for the last 2 days.  Objective/Medical story Patient needs evaluation for abdominal pain.  Was worked up with CT abdomen and ultrasound of the abdomen which shows gallbladder wall sludge with inflammation and cholecystitis.  Needs IV antibiotics and surgery consult. Goals of care determination:  Advance care directives goals of care treatment plan discussed Patient said he wants CPR, intubation ventilator for now need arises CODE STATUS: Full code Time spent discussing advanced care planning: 16 minutes

## 2019-04-28 NOTE — ED Notes (Signed)
PT informed of bed assignment Attempting to provide urine sample

## 2019-04-28 NOTE — ED Notes (Signed)
Gave pt a urine cup with bag.

## 2019-04-28 NOTE — ED Provider Notes (Signed)
[facility] Emergency Department Provider Note  ____________________________________________   First MD Initiated Contact with Patient 04/28/19 1158     (approximate)  I have reviewed the triage vital signs and the nursing notes.   HISTORY  Chief Complaint Abdominal Pain    HPI Michael Patrick is a 63 y.o. male with CHF, diabetes, stroke, history of atrial fibrillation on Coumadin and also history of aortic valve replacement with mechanical valve who presents for abdominal pain.  Patient says he had abdominal pain that started yesterday around noon.  The pain is over his entire abdomen and goes into his back.  The pain has been intermittent, nothing makes it better, nothing makes it worse.  He denies any fevers, cough, shortness of breath, chest pain, urinary symptoms, GU symptoms.  Patient was seen and had an x-ray to evaluate for kidney stones that was negative.  I discussed further with patient's wife.  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  Patient is currently on Coumadin 5 mg.          Past Medical History:  Diagnosis Date   Carotid artery occlusion    CHF (congestive heart failure) (HCC)    Diabetes mellitus without complication ([place])    Hyperlipidemia    Hypertension    Stroke [facility])     Patient Active Problem List   Diagnosis Date Noted   Aortic valve disorder 12/10/2018   Leg swelling 08/25/2018   PAD (peripheral artery disease) ([place]) 07/15/2018   Ischemic colitis ([place]) 05/10/2018   Medicare annual wellness visit, initial 12/03/2017   Atherosclerosis of native arteries of extremity with intermittent claudication ([place]) 10/08/2017   Diabetes ([place]) 10/08/2017   Essential hypertension 10/08/2017   History of malignant melanoma of skin 02/25/2017   Chronic narcotic use 01/19/2017   History of CVA (cerebrovascular accident) 01/19/2017   Type 2 diabetes mellitus with diabetic  polyneuropathy ([place]) 01/19/2017   Leg pain 09/15/2016   Degenerative joint disease (DJD) of lumbar spine 09/15/2016   Hyperlipidemia, mixed 04/02/2016   Chronic painful diabetic neuropathy ([place]) 03/06/2016   CHF, chronic ([place]) 04/13/2013   ICD (implantable cardioverter-defibrillator), biventricular, in situ 02/25/2013   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   Atrial fibrillation ([place]) 02/10/2013   CAD (coronary artery disease) 12/22/2012   Gout 12/22/2012   S/P aortic valve replacement 12/22/2012   S/P coronary artery stent placement 12/22/2012   S/P hip replacement 12/22/2012    Past Surgical History:  Procedure Laterality Date   AORTIC VALVE REPLACEMENT     COLONOSCOPY WITH PROPOFOL N/A 05/04/2018   Procedure: COLONOSCOPY WITH PROPOFOL;  Surgeon: Lollie Sails, MD;  Location: [facility] ENDOSCOPY;  Service: Endoscopy;  Laterality: N/A;   CORONARY STENT PLACEMENT     defibrillator/pacemaker     1992 Defib 4 yrs ago.Marland KitchenMarland KitchenMurmur/heart failure . during valve placement electrical component malfunction   ESOPHAGOGASTRODUODENOSCOPY (EGD) WITH PROPOFOL N/A 05/04/2018   Procedure: ESOPHAGOGASTRODUODENOSCOPY (EGD) WITH PROPOFOL;  Surgeon: Lollie Sails, MD;  Location: [facility] ENDOSCOPY;  Service: Endoscopy;  Laterality: N/A;   HIP SURGERY     VISCERAL ARTERY INTERVENTION N/A 05/11/2018   Procedure: VISCERAL ARTERY INTERVENTION;  Surgeon: Katha Cabal, MD;  Location: [facility] CV LAB;  Service: Cardiovascular;  Laterality: N/A;    Prior to Admission medications   Medication Sig Start Date End Date Taking? Authorizing Provider  aspirin EC 81 MG tablet Take 81 mg by mouth at bedtime.     [provider]  atorvastatin (LIPITOR) 10 MG tablet Take 1 tablet (10 mg total) by mouth daily at 6 PM. 05/12/18   Gouru, Aruna, MD  carvedilol (COREG) 12.5 MG tablet  08/05/16   [provider]  Cinnamon Bark POWD by Does not apply route.    [provider]    Cyanocobalamin (VITAMIN B-12) 5000 MCG SUBL Place under the tongue.    [provider]  diazepam (VALIUM) 5 MG tablet Take 5 mg by mouth 2 (two) times daily.  07/04/16   [provider]  finasteride (PROSCAR) 5 MG tablet TAKE ONE TABLET BY MOUTH EVERY DAY 04/07/16   [provider]  furosemide (LASIX) 40 MG tablet Take by mouth. 04/06/17 05/04/18  [provider]  gemfibrozil (LOPID) 600 MG tablet Take by mouth. 04/27/19   [provider]  glimepiride (AMARYL) 4 MG tablet Take 4 mg by mouth 2 (two) times daily.  06/02/17 06/02/18  [provider]  lisinopril (PRINIVIL,ZESTRIL) 40 MG tablet TAKE ONE TABLET EVERY DAY 04/07/16   [provider]  magnesium oxide (MAG-OX) 400 MG tablet Take 400 mg by mouth daily. Takes 200 mg in AM and 200 mg at bedtime    [provider]  niacin 250 MG tablet Take by mouth.    [provider]  nitroGLYCERIN (NITROSTAT) 0.4 MG SL tablet PLACE 1 TABLET UNDER TONGUE EVERY 5 MIN AS NEEDED FOR CHEST PAIN IF NO RELIEF IN15 MIN CALL 911 (MAX 3 TABS) 01/02/16   [provider]  ondansetron (ZOFRAN) 4 MG tablet Take by mouth. 01/12/15   [provider]  oxyCODONE-acetaminophen (PERCOCET/ROXICET) 5-325 MG tablet  08/19/16   [provider]  pantoprazole (PROTONIX) 40 MG tablet Take 40 mg by mouth daily. 03/17/18   [provider]  Polyethyl Glycol-Propyl Glycol 0.4-0.3 % SOLN Apply to eye.    [provider]  spironolactone (ALDACTONE) 25 MG tablet  09/01/16   [provider]  TIKOSYN 500 MCG capsule  06/30/16   [provider]  warfarin (COUMADIN) 5 MG tablet  09/01/16   [provider]    Allergies Insulins  Family History  Problem Relation Age of Onset   Hypertension Mother    Hypertension Father     Social History Social History   Tobacco Use   Smoking status: Current Some Day Smoker   Smokeless tobacco: Never Used   Substance Use Topics   Alcohol use: Yes    Comment: occassionally   Drug use: No      Review of Systems Constitutional: No fever/chills Eyes: No visual changes. ENT: No sore throat. Cardiovascular: Denies chest pain. Respiratory: Denies shortness of breath. Gastrointestinal: Positive abdominal pain no nausea, no vomiting.  No diarrhea.  No constipation. Genitourinary: Negative for dysuria. Musculoskeletal: Positive for back pain Skin: Negative for rash. Neurological: Negative for headaches, focal weakness or numbness. All other ROS negative ____________________________________________   PHYSICAL EXAM:  VITAL SIGNS: ED Triage Vitals [04/28/19 1130]  Enc Vitals Group     BP 122/75     Pulse Rate 70     Resp 17     Temp 99.1 F (37.3 C)     Temp Source Oral     SpO2 96 %     Weight 195 lb (88.5 kg)     Height 5\' 7"  (1.702 m)     Head Circumference      Peak Flow      Pain Score 9     Pain  Loc      Pain Edu?      Excl. in [place]?     Constitutional: Alert and oriented. Well appearing and in no acute distress. Eyes: Conjunctivae are normal. EOMI. Head: Atraumatic. Nose: No congestion/rhinnorhea. Mouth/Throat: Mucous membranes are moist.   Neck: No stridor. Trachea Midline. FROM Cardiovascular: Normal rate, regular rhythm. Grossly normal heart sounds.  Good peripheral circulation. Respiratory: Normal respiratory effort.  No retractions. Lungs CTAB. Gastrointestinal: Slightly distended and pain in all quadrants.No abdominal bruits.  Musculoskeletal: No lower extremity tenderness nor edema.  No joint effusions. Neurologic:  Normal speech and language. No gross focal neurologic deficits are appreciated.  Patient versus some confusion that is baseline from his stroke. Skin:  Skin is warm, dry and intact. No rash noted. Psychiatric: Mood and affect are normal. Speech and behavior are normal. GU: Deferred   ____________________________________________   LABS (all  labs ordered are listed, but only abnormal results are displayed)  Labs Reviewed  COMPREHENSIVE METABOLIC PANEL - Abnormal; Notable for the following components:      Result Value   Chloride 97 (*)    Glucose, Bld 318 (*)    All other components within normal limits  CBC - Abnormal; Notable for the following components:   WBC 19.8 (*)    All other components within normal limits  CULTURE, BLOOD (ROUTINE X 2)  CULTURE, BLOOD (ROUTINE X 2)  LIPASE, BLOOD  URINALYSIS, COMPLETE (UACMP) WITH MICROSCOPIC  PROTIME-INR  APTT  LACTIC ACID, PLASMA  LACTIC ACID, PLASMA  TYPE AND SCREEN   ____________________________________________   ED ECG REPORT I, Vanessa , the attending physician, personally viewed and interpreted this ECG.  EKG is a AV paced rhythm, some flipped T waves similar to prior, abnormal QTC and QRS due to being paced, no ST elevation. ____________________________________________  RADIOLOGY   Official radiology report(s): Dg Chest 1 View  Result Date: 04/28/2019 CLINICAL DATA:  Leukocytosis EXAM: CHEST  1 VIEW COMPARISON:  None. FINDINGS: Pacemaker leads are attached to the right atrium and right ventricle. Heart is borderline enlarged with pulmonary vascularity normal. There is aortic atherosclerosis. No lung edema or consolidation. No adenopathy. No bone lesions. There is calcification in each carotid artery with a stent in the left carotid artery, incompletely visualized. IMPRESSION: No edema or consolidation. Heart borderline enlarged with pacemaker leads attached to the right heart. Aortic Atherosclerosis (ICD10-I70.0). Foci of carotid artery calcification noted with incomplete visualization of a left carotid stent. Electronically Signed   By: Lowella Grip III M.D.   On: 04/28/2019 14:47   Abdomen 1 View (kub)  Result Date: 04/28/2019 CLINICAL DATA:  Diffuse abdominal pain EXAM: ABDOMEN - 1 VIEW COMPARISON:  12/10/2018 FINDINGS: Scattered large and small bowel  gas is noted. Left renal calculi are again identified and stable. No other definitive calculi are noted. Bilateral hip replacements are seen. Degenerative changes of lumbar spine are noted. IMPRESSION: Stable left renal calculi. Electronically Signed   By: Inez Catalina M.D.   On: 04/28/2019 14:39   Ct Angio Abd/pel W And/or Wo Contrast  Result Date: 04/28/2019 CLINICAL DATA:  Diffuse abdominal pain with nausea EXAM: CTA ABDOMEN AND PELVIS wITHOUT AND WITH CONTRAST TECHNIQUE: Multidetector CT imaging of the abdomen and pelvis was performed using the standard protocol during bolus administration of intravenous contrast. Multiplanar reconstructed images and MIPs were obtained and reviewed to evaluate the vascular anatomy. CONTRAST:  39mL OMNIPAQUE IOHEXOL 350 MG/ML SOLN COMPARISON:  04/13/2018, plain film from earlier in the  same day. FINDINGS: VASCULAR Aorta: Abdominal aorta demonstrates atherosclerotic calcification without evidence of focal aneurysmal dilatation or dissection. Celiac: Celiac axis demonstrates focal soft atherosclerotic plaque at the origin with narrowing of approximately 50%. Minimal poststenotic dilatation is identified. SMA: Calcific plaque is noted although no focal stenosis is seen. No filling defect to suggest mesenteric thrombosis is noted. Replaced right hepatic artery is noted. Renals: Dual renal arteries are noted on the left. The more inferior artery appears patent. Mild smooth atherosclerotic plaque is noted at the origin of the left renal artery with approximately 50% stenosis. Mild stenosis at the origin of the right renal artery is noted. IMA: Patent without evidence of aneurysm, dissection, vasculitis or significant stenosis. Iliacs: Moderate stenosis is noted within the right external iliac artery. Mild atherosclerotic change in the leftCommon iliac artery is again identified. These changes are stable from the prior CT examination. Veins: No venous abnormality is noted. Review of  the MIP images confirms the above findings. NON-VASCULAR Lower chest: Within normal limits. Hepatobiliary: Fatty infiltration of the liver is noted. The gallbladder is well distended. Some minimal pericholecystic inflammatory change is noted although no definitive wall thickening is seen. Ultrasound may be helpful for further evaluation. Pancreas: Unremarkable. No pancreatic ductal dilatation or surrounding inflammatory changes. Spleen: Normal in size without focal abnormality. Adrenals/Urinary Tract: Adrenal glands are within normal limits. Kidneys demonstrate a normal enhancement pattern bilaterally. Bilateral renal calculi are noted on the left in the lower pole stable from the prior study and a tiny stone in the right lower pole. Largest of these measures 11 mm in greatest dimension. No obstructive changes are seen. The bladder is decompressed evaluation is somewhat limited due to scatter artifact from bilateral hip replacements. Stomach/Bowel: Colon is well visualized. No evidence of significant inflammatory change or obstructive change is noted. The appendix is well visualized and within normal limits. No small bowel or gastric abnormality is seen. Lymphatic: No significant lymphatic abnormality is noted. Reproductive: Prostate is within normal limits although again obscured by scatter artifact. Other: No abdominal wall hernia or abnormality. No abdominopelvic ascites. Musculoskeletal: Degenerative changes of lumbar spine are noted. IMPRESSION: VASCULAR Mild stenosis of the celiac proximally. No evidence of mesenteric ischemia or thrombosis. Atherosclerotic changes are seen with left renal artery narrowing as well as involvement of the iliac arteries bilaterally right slightly greater than left. These changes are overall stable from the prior exam. NON-VASCULAR Mild pericholecystic inflammatory change. Ultrasound may be helpful for further evaluation to rule out acute cholecystitis. Bilateral renal calculi  similar to that seen on the prior exam. No obstructive changes are noted. Fatty liver. These findings were discussed with referring clinician at the time of exam interpretation. Electronically Signed   By: Inez Catalina M.D.   On: 04/28/2019 14:38   US Abdomen Limited Ruq  Result Date: 04/28/2019 CLINICAL DATA:  Diffuse abdominal pain with nausea since yesterday. EXAM: ULTRASOUND ABDOMEN LIMITED RIGHT UPPER QUADRANT COMPARISON:  CT 04/28/2019 FINDINGS: Gallbladder: Evidence of mild cholelithiasis as well as sludge. Minimal gallbladder wall thickening measuring 3-4 mm. Negative sonographic Murphy sign. No adjacent free fluid. Common bile duct: Diameter: 5.9 mm. Liver: Mild increased parenchymal echogenicity without focal mass. Portal vein is patent on color Doppler imaging with normal direction of blood flow towards the liver. IMPRESSION: Mild cholelithiasis and sludge with slight gallbladder wall thickening. Findings may be seen with acute cholecystitis as recommend clinical correlation. Mild hepatic steatosis without focal mass. Electronically Signed   By: Marin Olp M.D.  On: 04/28/2019 15:14    ____________________________________________   PROCEDURES  Procedure(s) performed (including Critical Care):  Procedures   ____________________________________________   INITIAL IMPRESSION / ASSESSMENT AND PLAN / ED COURSE  BRAXSON HOLLINGSWORTH was evaluated in Emergency Department on 04/28/2019 for the symptoms described in the history of present illness. He was evaluated in the context of the global COVID-19 pandemic, which necessitated consideration that the patient might be at risk for infection with the SARS-CoV-2 virus that causes COVID-19. Institutional protocols and algorithms that pertain to the evaluation of patients at risk for COVID-19 are in a state of rapid change based on information released by regulatory bodies including the CDC and federal and state organizations. These policies and  algorithms were followed during the patient's care in the ED.     This is most concerning for ischemic colitis given patient's prior history and pain out of proportion on exam.  Will get a CT scan to evaluate for ischemic colitis but also consider SBO, diverticulitis, appendicitis.  Labs are also obtained to evaluate for cholecystitis and pancreatitis although thought to be less likely.  Patient denies any urinary symptoms but urine was ordered to evaluate for UTI.  Patient has no chest pain to suggest ACS. We will give patient fentanyl and Zofran for his pain and nausea.  White count noted to be 19.8 which is concerning for abdominal infection.  Will get blood cultures, lactate and start patient on Zosyn/vanc  Does not meet SIRS criteria given vitals are stable.  I talked with radiology about appropriate imaging to evaluate for ischemic colitis.  Radiology will try to get patient to the scanner soon as possible.  2:11 PM discussed with radiology.  No concerning stenosis could be causing mesenteric ischemia.  They recommended ultrasound to evaluate the gallbladder given there was a little bit of distention but no obvious signs of cholecystitis.    Clinical Course as of Apr 27 1537  Thu Apr 28, 2019  1331 INR therapeutic at 2.5    [MF]  1528 Pt does have RUQ pain and [place] concerning for cholecystitis.  Will consult surg.    [MF]    Clinical Course User Index [MF] Vanessa Cottonwood, MD    Discussed with surgery team, Dr. Launa Flight they will come see patient.   Surgery will admit patient.     ____________________________________________   FINAL CLINICAL IMPRESSION(S) / ED DIAGNOSES   Final diagnoses:  Abdominal pain      MEDICATIONS GIVEN DURING THIS VISIT:  Medications  vancomycin (VANCOCIN) 1,500 mg in sodium chloride 0.9 % 500 mL IVPB (1,500 mg Intravenous New Bag/Given 04/28/19 1621)  piperacillin-tazobactam (ZOSYN) IVPB 3.375 g (has no administration in time range)  sodium  chloride flush (NS) 0.9 % injection 3 mL (3 mLs Intravenous Given 04/28/19 1242)  fentaNYL (SUBLIMAZE) injection 50 mcg (50 mcg Intravenous Given 04/28/19 1242)  ondansetron (ZOFRAN) injection 4 mg (4 mg Intravenous Given 04/28/19 1241)  piperacillin-tazobactam (ZOSYN) IVPB 3.375 g (0 g Intravenous Stopped 04/28/19 1401)  iohexol (OMNIPAQUE) 350 MG/ML injection 75 mL (75 mLs Intravenous Contrast Given 04/28/19 1252)  HYDROmorphone (DILAUDID) injection 0.5 mg (0.5 mg Intravenous Given 04/28/19 1422)  fentaNYL (SUBLIMAZE) injection 50 mcg (50 mcg Intravenous Given 04/28/19 1616)     ED Discharge Orders    None       Note:  This document was prepared using Dragon voice recognition software and may include unintentional dictation errors.   Vanessa , MD 04/28/19 [phone]

## 2019-04-28 NOTE — Consult Note (Signed)
Subjective:   CC: Diffuse abdominal pain  HPI:  Michael Patrick is a 63 y.o. male who is consulted by [facility] for evaluation of above cc.  Symptoms were first noted day and a half ago ago.  Sudden onset, no specific instigating factor.  No other associated symptoms such as nausea vomiting.  He does complain of a couple days of constipation though.  Pain is sharp, intense, started diffusely all over his abdomen and lower back.  After examination and several palpations during abdominal exam, patient is now stating that all the pain is localized to the right upper quadrant.  Patient states he is still uncomfortable at this time, pain medications only making him sleepy but not getting rid of the pain.  Exacerbated by palpation.  Seen in the urology office for history of chronic nonobstructing kidney stones, they ruled out kidney stones with a KUB and recommended patient present to the ED for further evaluation.  CTA was negative for mesenteric ischemia, for which he has a positive history of right-sided ischemic colitis, requiring stent placement.  CT did show possible inflammation around the gallbladder, but the subsequent ultrasound showed only minimal wall thickening and no evidence of obvious acute cholecystitis.  Past Medical History:  has a past medical history of Carotid artery occlusion, CHF (congestive heart failure) ([place]), Diabetes mellitus without complication ([place]), Hyperlipidemia, Hypertension, and Stroke ([place]).  Past Surgical History:  has a past surgical history that includes Aortic valve replacement; Coronary stent placement; Hip surgery; defibrillator/pacemaker; Colonoscopy with propofol (N/A, 05/04/2018); Esophagogastroduodenoscopy (egd) with propofol (N/A, 05/04/2018); and VISCERAL ARTERY INTERVENTION (N/A, 05/11/2018).  Family History: family history includes Hypertension in his father and mother.  Social History:  reports that he has been smoking. He has never used smokeless tobacco. He  reports current alcohol use. He reports that he does not use drugs.  Current Medications:  atorvastatin (LIPITOR) 10 MG tablet Take 1 tablet (10 mg total) by mouth daily at 6 PM. Gouru, Aruna, MD Needs Review   Patient taking differently: Take 80 mg by mouth daily at 6 PM.     carvedilol (COREG) 12.5 MG tablet Take 12.5 mg by mouth 2 (two) times daily with a meal.  [provider] Needs Review  Cinnamon Bark POWD by Does not apply route. [provider] Needs Review  Coenzyme Q10 (COQ-10) 100 MG CAPS Take 100 mg by mouth daily. [provider] Needs Review  Cyanocobalamin (VITAMIN B-12) 5000 MCG SUBL Place under the tongue. [provider] Needs Review  diazepam (VALIUM) 5 MG tablet Take 5 mg by mouth 2 (two) times daily as needed.  [provider] Needs Review  docusate sodium (COLACE) 100 MG capsule Take 100 mg by mouth daily. [provider] Needs Review  finasteride (PROSCAR) 5 MG tablet Take 5 mg by mouth daily.  [provider] Needs Review  furosemide (LASIX) 40 MG tablet Take 40 mg by mouth daily as needed.  [provider] Needs Review  gemfibrozil (LOPID) 600 MG tablet Take 600 mg by mouth 2 (two) times daily before a meal.  [provider] Needs Review  glimepiride (AMARYL) 4 MG tablet Take 4 mg by mouth 2 (two) times daily.  [provider] Needs Review  lisinopril (PRINIVIL,ZESTRIL) 40 MG tablet Take 40 mg by mouth daily.  [provider] Needs Review  nitroGLYCERIN (NITROSTAT) 0.4 MG SL tablet PLACE 1 TABLET UNDER TONGUE EVERY 5 MIN AS NEEDED FOR CHEST PAIN IF NO RELIEF IN15 MIN CALL  911 (MAX 3 TABS) [provider] Needs Review  ondansetron (ZOFRAN) 4 MG tablet Take 4 mg by mouth every 8 (eight) hours as needed.  [provider] Needs Review  oxyCODONE-acetaminophen (PERCOCET/ROXICET) 5-325 MG tablet Take 2 tablets by mouth 3 (three) times daily as needed.  [provider] Needs Review  pantoprazole (PROTONIX) 40 MG tablet Take 40 mg by mouth daily. [provider] Needs Review  psyllium (METAMUCIL) 58.6 % packet Take 1 packet by mouth daily. [provider] Needs Review  TIKOSYN 500 MCG capsule Take 500 mcg by mouth 2 (two) times daily.  [provider] Needs Review  warfarin (COUMADIN) 5 MG tablet Take 5 mg by mouth daily at 6 PM.  [provider] Needs Review  magnesium oxide (MAG-OX) 400 MG tablet Take 200 mg by mouth daily.  [provider] Needs Review     Allergies:  Allergies as of 04/28/2019 - Review Complete 04/28/2019  Allergen Reaction Noted  . Insulins Other (See Comments) 05/19/2018    ROS:  General: Denies weight loss, weight gain, fatigue, fevers, chills, and night sweats. Eyes: Denies blurry vision, double vision, eye pain, itchy eyes, and tearing. Ears: Denies hearing loss, earache, and ringing in ears. Nose: Denies sinus pain, congestion, infections, runny nose, and nosebleeds. Mouth/throat: Denies hoarseness, sore throat, bleeding gums, and difficulty swallowing. Heart: Denies chest pain, racing heart, leg pain or swelling, and decreased activity tolerance. Respiratory: Denies breathing difficulty, shortness of breath, wheezing, cough, and sputum. GI: Denies change in appetite, heartburn, nausea, vomiting, diarrhea, and blood in stool. GU: Denies difficulty urinating, pain with urinating, urgency, frequency, blood in urine. Musculoskeletal: Denies joint stiffness, pain, swelling, muscle weakness. Skin: Denies rash, itching, mass, tumors, sores, and boils Neurologic: Denies headache, fainting, dizziness, seizures, numbness, and tingling. Psychiatric: Denies depression, anxiety, difficulty sleeping, and memory loss. Endocrine: Denies heat or cold intolerance, and increased thirst or urination. Blood/lymph: Denies easy bruising, easy bruising, and swollen glands      Objective:     BP (!) 150/81 (BP Location: Right Arm)   Pulse 71   Temp 99.1 F (37.3 C) (Oral)   Resp 18   Ht 5\' 7"  (1.702 m)   Wt 88.5 kg   SpO2 95%   BMI 30.54 kg/m    Constitutional :  alert, cooperative, appears stated age and no distress  Lymphatics/Throat:  no asymmetry, masses, or scars  Respiratory:  clear to auscultation bilaterally  Cardiovascular:  irregularly irregular rhythm  Gastrointestinal: soft, positive guarding in RUQ with TTP, other quadrants also with TTP but not as severe in RUQ.  no rebound tenderness, no flank pain at time of exam.   Musculoskeletal: Steady movement  Skin: Cool and moist  Psychiatric: Normal affect, non-agitated, not confused       LABS:  CMP Latest Ref Rng & Units 04/28/2019 05/11/2018 05/10/2018  Glucose 70 - 99 mg/dL 318(H) 418(H) 476(H)  BUN 8 - 23 mg/dL 19 30(H) 32(H)  Creatinine 0.61 - 1.24 mg/dL 1.06 1.45(H) 1.71(H)  Sodium 135 - 145 mmol/L 135 135 132(L)  Potassium 3.5 - 5.1 mmol/L 4.8 4.4 4.6  Chloride 98 - 111 mmol/L 97(L) 102 97(L)  CO2 22 - 32 mmol/L 26 27 29   Calcium 8.9 - 10.3 mg/dL 9.7 8.2(L) 8.6(L)  Total Protein 6.5 - 8.1 g/dL 7.9 - 6.1(L)  Total Bilirubin 0.3 - 1.2 mg/dL 1.0 - 0.4  Alkaline Phos 38 - 126 U/L 104 - 72  AST 15 - 41 U/L  19 - 22  ALT 0 - 44 U/L 15 - 24   CBC Latest Ref Rng & Units 04/28/2019 05/12/2018 05/12/2018  WBC 4.0 - 10.5 K/uL 19.8(H) - -  Hemoglobin 13.0 - 17.0 g/dL 15.9 10.2(L) 10.1(L)  Hematocrit 39.0 - 52.0 % 48.9 - -  Platelets 150 - 400 K/uL 276 - -     RADS: CLINICAL DATA:  Diffuse abdominal pain with nausea since yesterday.  EXAM: ULTRASOUND ABDOMEN LIMITED RIGHT UPPER QUADRANT  COMPARISON:  CT 04/28/2019  FINDINGS: Gallbladder:  Evidence of mild cholelithiasis as well as sludge. Minimal gallbladder wall thickening measuring 3-4 mm. Negative sonographic Murphy sign. No adjacent free fluid.  Common bile duct:  Diameter: 5.9 mm.  Liver:  Mild increased  parenchymal echogenicity without focal mass. Portal vein is patent on color Doppler imaging with normal direction of blood flow towards the liver.  IMPRESSION: Mild cholelithiasis and sludge with slight gallbladder wall thickening. Findings may be seen with acute cholecystitis as recommend clinical correlation.  Mild hepatic steatosis without focal mass.   Electronically Signed   By: Marin Olp M.D.   On: 04/28/2019 15:14  CLINICAL DATA:  Diffuse abdominal pain with nausea  EXAM: CTA ABDOMEN AND PELVIS wITHOUT AND WITH CONTRAST  TECHNIQUE: Multidetector CT imaging of the abdomen and pelvis was performed using the standard protocol during bolus administration of intravenous contrast. Multiplanar reconstructed images and MIPs were obtained and reviewed to evaluate the vascular anatomy.  CONTRAST:  7mL OMNIPAQUE IOHEXOL 350 MG/ML SOLN  COMPARISON:  04/13/2018, plain film from earlier in the same day.  FINDINGS: VASCULAR  Aorta: Abdominal aorta demonstrates atherosclerotic calcification without evidence of focal aneurysmal dilatation or dissection.  Celiac: Celiac axis demonstrates focal soft atherosclerotic plaque at the origin with narrowing of approximately 50%. Minimal poststenotic dilatation is identified.  SMA: Calcific plaque is noted although no focal stenosis is seen. No filling defect to suggest mesenteric thrombosis is noted. Replaced right hepatic artery is noted.  Renals: Dual renal arteries are noted on the left. The more inferior artery appears patent. Mild smooth atherosclerotic plaque is noted at the origin of the left renal artery with approximately 50% stenosis. Mild stenosis at the origin of the right renal artery is noted.  IMA: Patent without evidence of aneurysm, dissection, vasculitis or significant stenosis.  Iliacs: Moderate stenosis is noted within the right external iliac artery. Mild atherosclerotic change in the  leftCommon iliac artery is again identified. These changes are stable from the prior CT examination.  Veins: No venous abnormality is noted.  Review of the MIP images confirms the above findings.  NON-VASCULAR  Lower chest: Within normal limits.  Hepatobiliary: Fatty infiltration of the liver is noted. The gallbladder is well distended. Some minimal pericholecystic inflammatory change is noted although no definitive wall thickening is seen. Ultrasound may be helpful for further evaluation.  Pancreas: Unremarkable. No pancreatic ductal dilatation or surrounding inflammatory changes.  Spleen: Normal in size without focal abnormality.  Adrenals/Urinary Tract: Adrenal glands are within normal limits. Kidneys demonstrate a normal enhancement pattern bilaterally. Bilateral renal calculi are noted on the left in the lower pole stable from the prior study and a tiny stone in the right lower pole. Largest of these measures 11 mm in greatest dimension. No obstructive changes are seen. The bladder is decompressed evaluation is somewhat limited due to scatter artifact from bilateral hip replacements.  Stomach/Bowel: Colon is well visualized. No evidence of significant inflammatory change or obstructive change is noted. The appendix is  well visualized and within normal limits. No small bowel or gastric abnormality is seen.  Lymphatic: No significant lymphatic abnormality is noted.  Reproductive: Prostate is within normal limits although again obscured by scatter artifact.  Other: No abdominal wall hernia or abnormality. No abdominopelvic ascites.  Musculoskeletal: Degenerative changes of lumbar spine are noted.  IMPRESSION: VASCULAR  Mild stenosis of the celiac proximally. No evidence of mesenteric ischemia or thrombosis.  Atherosclerotic changes are seen with left renal artery narrowing as well as involvement of the iliac arteries bilaterally right  slightly greater than left. These changes are overall stable from the prior exam.  NON-VASCULAR  Mild pericholecystic inflammatory change. Ultrasound may be helpful for further evaluation to rule out acute cholecystitis.  Bilateral renal calculi similar to that seen on the prior exam. No obstructive changes are noted.  Fatty liver.  These findings were discussed with referring clinician at the time of exam interpretation.   Electronically Signed   By: Inez Catalina M.D.   On: 04/28/2019 14:38  CLINICAL DATA:  Diffuse abdominal pain  EXAM: ABDOMEN - 1 VIEW  COMPARISON:  12/10/2018  FINDINGS: Scattered large and small bowel gas is noted. Left renal calculi are again identified and stable. No other definitive calculi are noted. Bilateral hip replacements are seen. Degenerative changes of lumbar spine are noted.  IMPRESSION: Stable left renal calculi.   Electronically Signed   By: Inez Catalina M.D.   On: 04/28/2019 14:39 Assessment:      Diffuse abdominal pain now localized to right upper quadrant Leukocytosis Lactic acidosis A. fib with history of pacemaker placement On Coumadin for anticoagulation History of TIA back in January Prosthetic aortic valve replacement- goal INR between 2 and 3 Chronic systolic congestive heart failure Carotid artery disease  Plan:     Patient presentation of diffuse intense abdominal pain, along with the non-definitive imaging studies makes me wonder if this is acute cholecystitis.  With his very complicated medical history with multiple comorbidities, will recommend a HIDA scan to definitively rule in or rule out cholecystitis prior to considering surgery.  Recommend continuing IV antibiotics meantime due to the increased leukocytosis.

## 2019-04-28 NOTE — Progress Notes (Addendum)
ANTICOAGULATION CONSULT NOTE - Initial Consult  Pharmacy Consult for Heparin Drip Indication: Transition from Warfarin in anticipation of possible surgery  Allergies  Allergen Reactions  . Insulins Other (See Comments)    Patient Measurements: Height: 5\' 7"  (170.2 cm) Weight: 195 lb (88.5 kg) IBW/kg (Calculated) : 66.1 Heparin Dosing Weight:    Vital Signs: Temp: 99.1 F (37.3 C) (07/16 1130) Temp Source: Oral (07/16 1130) BP: 150/81 (07/16 1539) Pulse Rate: 71 (07/16 1539)  Labs: Recent Labs    04/28/19 1139 04/28/19 1236  HGB 15.9  --   HCT 48.9  --   PLT 276  --   APTT  --  53*  LABPROT  --  26.9*  INR  --  2.5*  CREATININE 1.06  --     Estimated Creatinine Clearance: 75.8 mL/min (by C-G formula based on SCr of 1.06 mg/dL).   Medical History: Past Medical History:  Diagnosis Date  . Carotid artery occlusion   . CHF (congestive heart failure) ([place])   . Diabetes mellitus without complication ([place])   . Hyperlipidemia   . Hypertension   . Stroke [facility])     Medications:  Scheduled:   Infusions:  . piperacillin-tazobactam (ZOSYN)  IV    . vancomycin 1,500 mg (04/28/19 1621)    Assessment: 63 yo M on Warfarin for Hx Afib, Aortic Valve replacement. To be transitioned to Heparin drip. Goal INR per Cardiology notes= 2.0-3.0 On Warfarin 5 mg daily PTA per Med Rec Hgb 15.9  Plt 276  INR 2.5  Goal of Therapy:  Heparin level 0.3-0.7 units/ml Monitor platelets by anticoagulation protocol: Yes   Plan:  Will plan to start Heparin drip, with no bolus, when INR </= 2.0. Will check INR with am labs.   Tauheedah Bok A 04/28/2019,5:24 PM

## 2019-04-28 NOTE — ED Triage Notes (Signed)
Pt c/o BL abd pain that radiates around to the the flank with nausea since yesterday pt was seen by urology and had a KUB today that was negative for urological problems.

## 2019-04-28 NOTE — Progress Notes (Signed)
   04/28/2019 10:52 AM   [name] 12-25-1955 [id number]  Reason for visit: Abdominal and back pain  HPI: I saw Michael Patrick in urology clinic as an add-on today for severe abdominal and back pain.  He is regularly followed by Michael Patrick.  He is a 63 year old male with numerous comorbidities including CHF, diabetes, stroke, atrial fibrillation on Coumadin, aortic valve replacement, and ischemic colitis who presents with 18 hours of severe diffuse abdominal and back pain.  He reports there are no aggravating or alleviating factors.  He has a history of bilateral nonobstructing nephrolithiasis, that has been on surveillance.  He has not had a bowel movement for over 3 days.  He denies any difficulty with urination or dysuria.  He denies any fevers or chills.  He rates his pain is 10 out of 10 at its worst.   ROS: Please see flowsheet from today's date for complete review of systems.  Physical Exam: BP 128/80   Pulse 80   Ht 5\' 7"  (1.702 m)   Wt 185 lb (83.9 kg)   BMI 28.98 kg/m    Constitutional:  Alert and oriented, No acute distress.  Sitting in wheelchair Respiratory: Normal respiratory effort, no increased work of breathing. GI: Abdomen is soft, diffusely tender, nondistended, non-peritoneal neck Skin: No rashes, bruises or suspicious lesions. Neurologic: Grossly intact, no focal deficits, moving all 4 extremities. Psychiatric: Normal mood and affect  Pertinent Imaging: I have personally reviewed the KUB today, no evidence of ureteral stones, renal stones appear stable bilaterally  Assessment & Plan:   In summary, the patient is a 63 year old male with numerous Patrick-morbidities and extensive vascular history, including history of ischemic colitis who presents with 18 hours of severe diffuse abdominal and back pain bilaterally.  His history is not consistent with renal colic, and KUB does not show any evidence of ureteral stones today.  With his complex history, and especially  his history of ischemic colitis, I recommended he present to the ER for further evaluation.  Sent to ED from clinic for further work-up of severe abdominal and back pain Follow-up in 1 year with Michael Patrick with KUB for stone surveillance  Michael Co, MD  [place] [address], [place], [place] [address] [phone]

## 2019-04-28 NOTE — ED Notes (Signed)
ED TO INPATIENT HANDOFF REPORT  ED Nurse Name and Phone #: Arabella Revelle [phone]  S Name/Age/Gender Michael Patrick 63 y.o. male Room/Bed: ED18A/ED18A  Code Status   Code Status: Prior  Home/SNF/Other Home Patient oriented to: self, place, time and situation Is this baseline? Yes   Triage Complete: Triage complete  Chief Complaint lower back and abd pain  Triage Note Pt c/o BL abd pain that radiates around to the the flank with nausea since yesterday pt was seen by urology and had a KUB today that was negative for urological problems.    Allergies Allergies  Allergen Reactions  . Insulins Other (See Comments)    Level of Care/Admitting Diagnosis ED Disposition    ED Disposition Condition Comment   Admit  Hospital Area: [place] [[id number]]  Level of Care: Med-Surg [16]  Covid Evaluation: Confirmed COVID Negative  Diagnosis: Acute cholecystitis [575.0.ICD-9-CM]  Admitting Physician: Saundra Shelling [[id number]]  Attending Physician: Saundra Shelling [[id number]]  Estimated length of stay: past midnight tomorrow  Certification:: I certify this patient will need inpatient services for at least 2 midnights  PT Class (Do Not Modify): Inpatient [101]  PT Acc Code (Do Not Modify): Private [1]       B Medical/Surgery History Past Medical History:  Diagnosis Date  . Carotid artery occlusion   . CHF (congestive heart failure) ([place])   . Diabetes mellitus without complication ([place])   . Hyperlipidemia   . Hypertension   . Stroke [facility])    Past Surgical History:  Procedure Laterality Date  . AORTIC VALVE REPLACEMENT    . COLONOSCOPY WITH PROPOFOL N/A 05/04/2018   Procedure: COLONOSCOPY WITH PROPOFOL;  Surgeon: Lollie Sails, MD;  Location: [facility] ENDOSCOPY;  Service: Endoscopy;  Laterality: N/A;  . CORONARY STENT PLACEMENT    . defibrillator/pacemaker     1992 Defib 4 yrs ago.Marland KitchenMarland KitchenMurmur/heart failure . during valve placement electrical component malfunction  .  ESOPHAGOGASTRODUODENOSCOPY (EGD) WITH PROPOFOL N/A 05/04/2018   Procedure: ESOPHAGOGASTRODUODENOSCOPY (EGD) WITH PROPOFOL;  Surgeon: Lollie Sails, MD;  Location: [facility] ENDOSCOPY;  Service: Endoscopy;  Laterality: N/A;  . HIP SURGERY    . VISCERAL ARTERY INTERVENTION N/A 05/11/2018   Procedure: VISCERAL ARTERY INTERVENTION;  Surgeon: Katha Cabal, MD;  Location: [place] CV LAB;  Service: Cardiovascular;  Laterality: N/A;     A IV Location/Drains/Wounds Patient Lines/Drains/Airways Status   Active Line/Drains/Airways    Name:   Placement date:   Placement time:   Site:   Days:   Peripheral IV 04/28/19 Left Antecubital   04/28/19    1236    Antecubital   less than 1          Intake/Output Last 24 hours  Intake/Output Summary (Last 24 hours) at 04/28/2019 1700 Last data filed at 04/28/2019 1401 Gross per 24 hour  Intake 50 ml  Output -  Net 50 ml    Labs/Imaging Results for orders placed or performed during the hospital encounter of 04/28/19 (from the past 48 hour(s))  Lipase, blood     Status: None   Collection Time: 04/28/19 11:39 AM  Result Value Ref Range   Lipase 23 11 - 51 U/L    Comment: Performed at [facility], [address] [place], [place] [address]  Comprehensive metabolic panel     Status: Abnormal   Collection Time: 04/28/19 11:39 AM  Result Value Ref Range   Sodium 135 135 - 145 mmol/L   Potassium 4.8 3.5 - 5.1 mmol/L  Chloride 97 (L) 98 - 111 mmol/L   CO2 26 22 - 32 mmol/L   Glucose, Bld 318 (H) 70 - 99 mg/dL   BUN 19 8 - 23 mg/dL   Creatinine, Ser 1.06 0.61 - 1.24 mg/dL   Calcium 9.7 8.9 - 10.3 mg/dL   Total Protein 7.9 6.5 - 8.1 g/dL   Albumin 4.1 3.5 - 5.0 g/dL   AST 19 15 - 41 U/L   ALT 15 0 - 44 U/L   Alkaline Phosphatase 104 38 - 126 U/L   Total Bilirubin 1.0 0.3 - 1.2 mg/dL   GFR calc non Af Amer >60 >60 mL/min   GFR calc Af Amer >60 >60 mL/min   Anion gap 12 5 - 15    Comment: Performed at [facility],  [address] [place], [place] [address]  CBC     Status: Abnormal   Collection Time: 04/28/19 11:39 AM  Result Value Ref Range   WBC 19.8 (H) 4.0 - 10.5 K/uL   RBC 5.76 4.22 - 5.81 MIL/uL   Hemoglobin 15.9 13.0 - 17.0 g/dL   HCT 48.9 39.0 - 52.0 %   MCV 84.9 80.0 - 100.0 fL   MCH 27.6 26.0 - 34.0 pg   MCHC 32.5 30.0 - 36.0 g/dL   RDW 14.4 11.5 - 15.5 %   Platelets 276 150 - 400 K/uL   nRBC 0.0 0.0 - 0.2 %    Comment: Performed at [facility], [address] [place], [place] [address]  Protime-INR     Status: Abnormal   Collection Time: 04/28/19 12:36 PM  Result Value Ref Range   Prothrombin Time 26.9 (H) 11.4 - 15.2 seconds   INR 2.5 (H) 0.8 - 1.2    Comment: (NOTE) INR goal varies based on device and disease states. Performed at [facility], [address] [place], [place] [address]   APTT     Status: Abnormal   Collection Time: 04/28/19 12:36 PM  Result Value Ref Range   aPTT 53 (H) 24 - 36 seconds    Comment:        IF BASELINE aPTT IS ELEVATED, SUGGEST PATIENT RISK ASSESSMENT BE USED TO DETERMINE APPROPRIATE ANTICOAGULANT THERAPY. Performed at [facility], [address] [place], [place] [address]   Lactic acid, plasma     Status: Abnormal   Collection Time: 04/28/19 12:36 PM  Result Value Ref Range   Lactic Acid, Venous 2.0 (HH) 0.5 - 1.9 mmol/L    Comment: CRITICAL RESULT CALLED TO, READ BACK BY AND VERIFIED WITH [place] MARTIN AT 1317 ON 04/28/2019 JJB Performed at [facility], [address] [place], [place] [address]   Type and screen [place]     Status: None   Collection Time: 04/28/19 12:36 PM  Result Value Ref Range   ABO/RH(D) A POS    Antibody Screen NEG    Sample Expiration      05/01/2019,2359 Performed at [facility], [address] [place],  [address]   SARS Coronavirus 2 (CEPHEID - Performed in [place] hospital lab), Hosp Order     Status: None    Collection Time: 04/28/19  2:04 PM   Specimen: Nasopharyngeal Swab  Result Value Ref Range   SARS Coronavirus 2 NEGATIVE NEGATIVE    Comment: (NOTE) If result is NEGATIVE SARS-CoV-2 target nucleic acids are NOT DETECTED. The SARS-CoV-2 RNA is generally detectable in upper and lower  respiratory specimens during  the acute phase of infection. The lowest  concentration of SARS-CoV-2 viral copies this assay can detect is 250  copies / mL. A negative result does not preclude SARS-CoV-2 infection  and should not be used as the sole basis for treatment or other  patient management decisions.  A negative result may occur with  improper specimen collection / handling, submission of specimen other  than nasopharyngeal swab, presence of viral mutation(s) within the  areas targeted by this assay, and inadequate number of viral copies  (<250 copies / mL). A negative result must be combined with clinical  observations, patient history, and epidemiological information. If result is POSITIVE SARS-CoV-2 target nucleic acids are DETECTED. The SARS-CoV-2 RNA is generally detectable in upper and lower  respiratory specimens dur ing the acute phase of infection.  Positive  results are indicative of active infection with SARS-CoV-2.  Clinical  correlation with patient history and other diagnostic information is  necessary to determine patient infection status.  Positive results do  not rule out bacterial infection or co-infection with other viruses. If result is PRESUMPTIVE POSTIVE SARS-CoV-2 nucleic acids MAY BE PRESENT.   A presumptive positive result was obtained on the submitted specimen  and confirmed on repeat testing.  While 2019 novel coronavirus  (SARS-CoV-2) nucleic acids may be present in the submitted sample  additional confirmatory testing may be necessary for epidemiological  and / or clinical management purposes  to differentiate between  SARS-CoV-2 and other Sarbecovirus currently known  to infect humans.  If clinically indicated additional testing with an alternate test  methodology [phone]) is advised. The SARS-CoV-2 RNA is generally  detectable in upper and lower respiratory sp ecimens during the acute  phase of infection. The expected result is Negative. Fact Sheet for Patients:  StrictlyIdeas.no Fact Sheet for Healthcare Providers: BankingDealers.co.za This test is not yet approved or cleared by the [place] FDA and has been authorized for detection and/or diagnosis of SARS-CoV-2 by FDA under an Emergency Use Authorization (EUA).  This EUA will remain in effect (meaning this test can be used) for the duration of the COVID-19 declaration under Section 564(b)(1) of the Act, 21 U.S.C. section 360bbb-3(b)(1), unless the authorization is terminated or revoked sooner. Performed at [facility], [address] [place], [place] [address]   Lactic acid, plasma     Status: None   Collection Time: 04/28/19  3:35 PM  Result Value Ref Range   Lactic Acid, Venous 1.7 0.5 - 1.9 mmol/L    Comment: Performed at [facility], [address] [place], [place] [address]   Dg Chest 1 View  Result Date: 04/28/2019 CLINICAL DATA:  Leukocytosis EXAM: CHEST  1 VIEW COMPARISON:  None. FINDINGS: Pacemaker leads are attached to the right atrium and right ventricle. Heart is borderline enlarged with pulmonary vascularity normal. There is aortic atherosclerosis. No lung edema or consolidation. No adenopathy. No bone lesions. There is calcification in each carotid artery with a stent in the left carotid artery, incompletely visualized. IMPRESSION: No edema or consolidation. Heart borderline enlarged with pacemaker leads attached to the right heart. Aortic Atherosclerosis (ICD10-I70.0). Foci of carotid artery calcification noted with incomplete visualization of a left carotid stent. Electronically Signed   By: Lowella Grip III M.D.   On: 04/28/2019 14:47   Abdomen 1 View (kub)  Result Date: 04/28/2019 CLINICAL DATA:  Diffuse abdominal pain EXAM: ABDOMEN - 1 VIEW COMPARISON:  12/10/2018 FINDINGS: Scattered large and small bowel gas is noted. Left renal  calculi are again identified and stable. No other definitive calculi are noted. Bilateral hip replacements are seen. Degenerative changes of lumbar spine are noted. IMPRESSION: Stable left renal calculi. Electronically Signed   By: Inez Catalina M.D.   On: 04/28/2019 14:39   Ct Angio Abd/pel W And/or Wo Contrast  Result Date: 04/28/2019 CLINICAL DATA:  Diffuse abdominal pain with nausea EXAM: CTA ABDOMEN AND PELVIS wITHOUT AND WITH CONTRAST TECHNIQUE: Multidetector CT imaging of the abdomen and pelvis was performed using the standard protocol during bolus administration of intravenous contrast. Multiplanar reconstructed images and MIPs were obtained and reviewed to evaluate the vascular anatomy. CONTRAST:  87mL OMNIPAQUE IOHEXOL 350 MG/ML SOLN COMPARISON:  04/13/2018, plain film from earlier in the same day. FINDINGS: VASCULAR Aorta: Abdominal aorta demonstrates atherosclerotic calcification without evidence of focal aneurysmal dilatation or dissection. Celiac: Celiac axis demonstrates focal soft atherosclerotic plaque at the origin with narrowing of approximately 50%. Minimal poststenotic dilatation is identified. SMA: Calcific plaque is noted although no focal stenosis is seen. No filling defect to suggest mesenteric thrombosis is noted. Replaced right hepatic artery is noted. Renals: Dual renal arteries are noted on the left. The more inferior artery appears patent. Mild smooth atherosclerotic plaque is noted at the origin of the left renal artery with approximately 50% stenosis. Mild stenosis at the origin of the right renal artery is noted. IMA: Patent without evidence of aneurysm, dissection, vasculitis or significant stenosis. Iliacs: Moderate stenosis is noted  within the right external iliac artery. Mild atherosclerotic change in the leftCommon iliac artery is again identified. These changes are stable from the prior CT examination. Veins: No venous abnormality is noted. Review of the MIP images confirms the above findings. NON-VASCULAR Lower chest: Within normal limits. Hepatobiliary: Fatty infiltration of the liver is noted. The gallbladder is well distended. Some minimal pericholecystic inflammatory change is noted although no definitive wall thickening is seen. Ultrasound may be helpful for further evaluation. Pancreas: Unremarkable. No pancreatic ductal dilatation or surrounding inflammatory changes. Spleen: Normal in size without focal abnormality. Adrenals/Urinary Tract: Adrenal glands are within normal limits. Kidneys demonstrate a normal enhancement pattern bilaterally. Bilateral renal calculi are noted on the left in the lower pole stable from the prior study and a tiny stone in the right lower pole. Largest of these measures 11 mm in greatest dimension. No obstructive changes are seen. The bladder is decompressed evaluation is somewhat limited due to scatter artifact from bilateral hip replacements. Stomach/Bowel: Colon is well visualized. No evidence of significant inflammatory change or obstructive change is noted. The appendix is well visualized and within normal limits. No small bowel or gastric abnormality is seen. Lymphatic: No significant lymphatic abnormality is noted. Reproductive: Prostate is within normal limits although again obscured by scatter artifact. Other: No abdominal wall hernia or abnormality. No abdominopelvic ascites. Musculoskeletal: Degenerative changes of lumbar spine are noted. IMPRESSION: VASCULAR Mild stenosis of the celiac proximally. No evidence of mesenteric ischemia or thrombosis. Atherosclerotic changes are seen with left renal artery narrowing as well as involvement of the iliac arteries bilaterally right slightly greater than  left. These changes are overall stable from the prior exam. NON-VASCULAR Mild pericholecystic inflammatory change. Ultrasound may be helpful for further evaluation to rule out acute cholecystitis. Bilateral renal calculi similar to that seen on the prior exam. No obstructive changes are noted. Fatty liver. These findings were discussed with referring clinician at the time of exam interpretation. Electronically Signed   By: Inez Catalina M.D.   On: 04/28/2019  14:38   US Abdomen Limited Ruq  Result Date: 04/28/2019 CLINICAL DATA:  Diffuse abdominal pain with nausea since yesterday. EXAM: ULTRASOUND ABDOMEN LIMITED RIGHT UPPER QUADRANT COMPARISON:  CT 04/28/2019 FINDINGS: Gallbladder: Evidence of mild cholelithiasis as well as sludge. Minimal gallbladder wall thickening measuring 3-4 mm. Negative sonographic Murphy sign. No adjacent free fluid. Common bile duct: Diameter: 5.9 mm. Liver: Mild increased parenchymal echogenicity without focal mass. Portal vein is patent on color Doppler imaging with normal direction of blood flow towards the liver. IMPRESSION: Mild cholelithiasis and sludge with slight gallbladder wall thickening. Findings may be seen with acute cholecystitis as recommend clinical correlation. Mild hepatic steatosis without focal mass. Electronically Signed   By: Marin Olp M.D.   On: 04/28/2019 15:14    Pending Labs Unresulted Labs (From admission, onward)    Start     Ordered   04/28/19 1216  Blood culture (routine x 2)  BLOOD CULTURE X 2,   STAT     04/28/19 1215   04/28/19 1132  Urinalysis, Complete w Microscopic  ONCE - STAT,   STAT     04/28/19 1132   Signed and Held  Comprehensive metabolic panel  Tomorrow morning,   R     Signed and Held   Signed and Held  CBC  Tomorrow morning,   R     Signed and Held          Vitals/Pain Today's Vitals   04/28/19 1130 04/28/19 1330 04/28/19 1539  BP: 122/75 (!) 143/94 (!) 150/81  Pulse: 70 70 71  Resp: 17  18  Temp: 99.1 F (37.3  C)    TempSrc: Oral    SpO2: 96% 95% 95%  Weight: 88.5 kg    Height: 5\' 7"  (1.702 m)    PainSc: 9       Isolation Precautions No active isolations  Medications Medications  vancomycin (VANCOCIN) 1,500 mg in sodium chloride 0.9 % 500 mL IVPB (1,500 mg Intravenous New Bag/Given 04/28/19 1621)  sodium chloride flush (NS) 0.9 % injection 3 mL (3 mLs Intravenous Given 04/28/19 1242)  fentaNYL (SUBLIMAZE) injection 50 mcg (50 mcg Intravenous Given 04/28/19 1242)  ondansetron (ZOFRAN) injection 4 mg (4 mg Intravenous Given 04/28/19 1241)  piperacillin-tazobactam (ZOSYN) IVPB 3.375 g (0 g Intravenous Stopped 04/28/19 1401)  iohexol (OMNIPAQUE) 350 MG/ML injection 75 mL (75 mLs Intravenous Contrast Given 04/28/19 1252)  HYDROmorphone (DILAUDID) injection 0.5 mg (0.5 mg Intravenous Given 04/28/19 1422)  fentaNYL (SUBLIMAZE) injection 50 mcg (50 mcg Intravenous Given 04/28/19 1616)    Mobility walks Low fall risk   Focused Assessments Cardiac Assessment Handoff:    No results found for: CKTOTAL, CKMB, CKMBINDEX, TROPONINI No results found for: DDIMER Does the Patient currently have chest pain? No      R Recommendations: See Admitting Provider Note  Report given to:   Additional Notes:

## 2019-04-28 NOTE — Progress Notes (Signed)
Pharmacy Antibiotic Note  Michael Patrick is a 63 y.o. male admitted on 04/28/2019 with Intra-abdominal infection.  Pharmacy has been consulted for Zosyn dosing. ?poss acute cholecystitis.  Plan: Zosyn 3.375g IV q8h (4 hour infusion).  Height: 5\' 7"  (170.2 cm) Weight: 195 lb (88.5 kg) IBW/kg (Calculated) : 66.1  Temp (24hrs), Avg:99.1 F (37.3 C), Min:99.1 F (37.3 C), Max:99.1 F (37.3 C)  Recent Labs  Lab 04/28/19 1139 04/28/19 1236 04/28/19 1535  WBC 19.8*  --   --   CREATININE 1.06  --   --   LATICACIDVEN  --  2.0* 1.7    Estimated Creatinine Clearance: 75.8 mL/min (by C-G formula based on SCr of 1.06 mg/dL).    Allergies  Allergen Reactions  . Insulins Other (See Comments)    Antimicrobials this admission: Zosyn  7/16 >>   Vanc x 1 in ER 7/16   Dose adjustments this admission:    Microbiology results: 7/16 BCx: pend   UCx:      Sputum:      MRSA PCR:    Thank you for allowing pharmacy to be a part of this patient's care.  Marche Hottenstein A 04/28/2019 5:21 PM

## 2019-04-28 NOTE — Telephone Encounter (Signed)
Patient seen in clinic today with Dr. Al Pimple

## 2019-04-28 NOTE — H&P (Signed)
[facility] [place] NAME: Michael Patrick    MR#:  [id number]  DATE OF BIRTH:  01/23/1956  DATE OF ADMISSION:  04/28/2019  PRIMARY CARE PHYSICIAN: Rusty Aus, MD   REQUESTING/REFERRING PHYSICIAN:   CHIEF COMPLAINT:   Chief Complaint  Patient presents with  . Abdominal Pain    HISTORY OF PRESENT ILLNESS: Michael Patrick  is a 63 y.o. male with a known history of aortic valve replacement on Coumadin for anticoagulation, hypertension, hyperlipidemia, CVA, chronic congestive heart failure, type 2 diabetes mellitus, coronary artery disease, cardiac stent placement presented to the emergency room with right upper quadrant pain for the last 2 days.  Patient has pain in the right upper quadrant which is sharp in nature 6 out of 10 on a scale of 1-10.  Has some nausea but no vomiting.  No complains of any fever.  He was evaluated with CT abdomen and ultrasound of the abdomen which showed a gallbladder wall thickening with sludge and cholecystitis.  Patient started on IV Zosyn antibiotic surgical service consulted.  PAST MEDICAL HISTORY:   Past Medical History:  Diagnosis Date  . Carotid artery occlusion   . CHF (congestive heart failure) ([place])   . Diabetes mellitus without complication ([place])   . Hyperlipidemia   . Hypertension   . Stroke [facility])     PAST SURGICAL HISTORY:  Past Surgical History:  Procedure Laterality Date  . AORTIC VALVE REPLACEMENT    . COLONOSCOPY WITH PROPOFOL N/A 05/04/2018   Procedure: COLONOSCOPY WITH PROPOFOL;  Surgeon: Lollie Sails, MD;  Location[facility] ENDOSCOPY;  Service: Endoscopy;  Laterality: N/A;  . CORONARY STENT PLACEMENT    . defibrillator/pacemaker     1992 Defib 4 yrs ago.Marland KitchenMarland KitchenMurmur/heart failure . during valve placement electrical component malfunction  . ESOPHAGOGASTRODUODENOSCOPY (EGD) WITH PROPOFOL N/A 05/04/2018   Procedure: ESOPHAGOGASTRODUODENOSCOPY (EGD) WITH PROPOFOL;  Surgeon: Lollie Sails, MD;  Location: [facility] ENDOSCOPY;  Service: Endoscopy;  Laterality: N/A;  . HIP SURGERY    . VISCERAL ARTERY INTERVENTION N/A 05/11/2018   Procedure: VISCERAL ARTERY INTERVENTION;  Surgeon: Katha Cabal, MD;  Location: [facility] CV LAB;  Service: Cardiovascular;  Laterality: N/A;    SOCIAL HISTORY:  Social History   Tobacco Use  . Smoking status: Current Some Day Smoker  . Smokeless tobacco: Never Used  Substance Use Topics  . Alcohol use: Yes    Comment: occassionally    FAMILY HISTORY:  Family History  Problem Relation Age of Onset  . Hypertension Mother   . Hypertension Father     DRUG ALLERGIES:  Allergies  Allergen Reactions  . Insulins Other (See Comments)    REVIEW OF SYSTEMS:   CONSTITUTIONAL: No fever, fatigue or weakness.  EYES: No blurred or double vision.  EARS, NOSE, AND THROAT: No tinnitus or ear pain.  RESPIRATORY: No cough, shortness of breath, wheezing or hemoptysis.  CARDIOVASCULAR: No chest pain, orthopnea, edema.  GASTROINTESTINAL: Has nausea, no vomiting, diarrhea  has abdominal pain.  GENITOURINARY: No dysuria, hematuria.  ENDOCRINE: No polyuria, nocturia,  HEMATOLOGY: No anemia, easy bruising or bleeding SKIN: No rash or lesion. MUSCULOSKELETAL: No joint pain or arthritis.   NEUROLOGIC: No tingling, numbness, weakness.  PSYCHIATRY: No anxiety or depression.   MEDICATIONS AT HOME:  Prior to Admission medications   Medication Sig Start Date End Date Taking? Authorizing Provider  atorvastatin (LIPITOR) 10 MG tablet Take 1 tablet (10 mg total) by mouth daily at 6  PM. Patient taking differently: Take 80 mg by mouth daily at 6 PM.  05/12/18  Yes Gouru, Aruna, MD  carvedilol (COREG) 12.5 MG tablet Take 12.5 mg by mouth 2 (two) times daily with a meal.  08/05/16  Yes [provider]  Cinnamon Bark POWD by Does not apply route.   Yes [provider]  Coenzyme Q10 (COQ-10) 100 MG CAPS Take 100 mg by mouth daily.   Yes  [provider]  Cyanocobalamin (VITAMIN B-12) 5000 MCG SUBL Place under the tongue.   Yes [provider]  diazepam (VALIUM) 5 MG tablet Take 5 mg by mouth 2 (two) times daily as needed.  07/04/16  Yes [provider]  docusate sodium (COLACE) 100 MG capsule Take 100 mg by mouth daily.   Yes [provider]  finasteride (PROSCAR) 5 MG tablet Take 5 mg by mouth daily.  04/07/16  Yes [provider]  furosemide (LASIX) 40 MG tablet Take 40 mg by mouth daily as needed.  04/06/17 04/28/19 Yes [provider]  gemfibrozil (LOPID) 600 MG tablet Take 600 mg by mouth 2 (two) times daily before a meal.  04/27/19  Yes [provider]  glimepiride (AMARYL) 4 MG tablet Take 4 mg by mouth 2 (two) times daily.  06/02/17 04/28/19 Yes [provider]  lisinopril (PRINIVIL,ZESTRIL) 40 MG tablet Take 40 mg by mouth daily.  04/07/16  Yes [provider]  nitroGLYCERIN (NITROSTAT) 0.4 MG SL tablet PLACE 1 TABLET UNDER TONGUE EVERY 5 MIN AS NEEDED FOR CHEST PAIN IF NO RELIEF IN15 MIN CALL 911 (MAX 3 TABS) 01/02/16  Yes [provider]  ondansetron (ZOFRAN) 4 MG tablet Take 4 mg by mouth every 8 (eight) hours as needed.  01/12/15  Yes [provider]  oxyCODONE-acetaminophen (PERCOCET/ROXICET) 5-325 MG tablet Take 2 tablets by mouth 3 (three) times daily as needed.  08/19/16  Yes [provider]  pantoprazole (PROTONIX) 40 MG tablet Take 40 mg by mouth daily. 03/17/18  Yes [provider]  psyllium (METAMUCIL) 58.6 % packet Take 1 packet by mouth daily.   Yes [provider]  TIKOSYN 500 MCG capsule Take 500 mcg by mouth 2 (two) times daily.  06/30/16  Yes [provider]  warfarin (COUMADIN) 5 MG tablet Take 5 mg by mouth daily at 6 PM.  09/01/16  Yes [provider]  magnesium oxide (MAG-OX) 400 MG tablet Take 200 mg by mouth daily.     [provider]      PHYSICAL EXAMINATION:    VITAL SIGNS: Blood pressure (!) 150/81, pulse 71, temperature 99.1 F (37.3 C), temperature source Oral, resp. rate 18, height 5\' 7"  (1.702 m), weight 88.5 kg, SpO2 95 %.  GENERAL:  63 y.o.-year-old patient lying in the bed with no acute distress.  EYES: Pupils equal, round, reactive to light and accommodation. No scleral icterus. Extraocular muscles intact.  HEENT: Head atraumatic, normocephalic. Oropharynx and nasopharynx clear.  NECK:  Supple, no jugular venous distention. No thyroid enlargement, no tenderness.  LUNGS: Normal breath sounds bilaterally, no wheezing, rales,rhonchi or crepitation. No use of accessory muscles of respiration.  CARDIOVASCULAR: S1, S2 normal. No murmurs, rubs, or gallops.  ABDOMEN: Soft, tenderness right upper quadrant, nondistended. Bowel sounds present. No organomegaly or mass.  EXTREMITIES: No pedal edema, cyanosis, or clubbing.  NEUROLOGIC: Cranial nerves II through XII are intact. Muscle strength 5/5 in all extremities. Sensation intact. Gait not checked.  PSYCHIATRIC: The patient is alert and oriented  x 3.  SKIN: No obvious rash, lesion, or ulcer.   LABORATORY PANEL:   CBC Recent Labs  Lab 04/28/19 1139  WBC 19.8*  HGB 15.9  HCT 48.9  PLT 276  MCV 84.9  MCH 27.6  MCHC 32.5  RDW 14.4   ------------------------------------------------------------------------------------------------------------------  Chemistries  Recent Labs  Lab 04/28/19 1139  NA 135  K 4.8  CL 97*  CO2 26  GLUCOSE 318*  BUN 19  CREATININE 1.06  CALCIUM 9.7  AST 19  ALT 15  ALKPHOS 104  BILITOT 1.0   ------------------------------------------------------------------------------------------------------------------ estimated creatinine clearance is 75.8 mL/min (by C-G formula based on SCr of 1.06 mg/dL). ------------------------------------------------------------------------------------------------------------------ No results for input(s): TSH, T4TOTAL,  T3FREE, THYROIDAB in the last 72 hours.  Invalid input(s): FREET3   Coagulation profile Recent Labs  Lab 04/28/19 1236  INR 2.5*   ------------------------------------------------------------------------------------------------------------------- No results for input(s): DDIMER in the last 72 hours. -------------------------------------------------------------------------------------------------------------------  Cardiac Enzymes No results for input(s): CKMB, TROPONINI, MYOGLOBIN in the last 168 hours.  Invalid input(s): CK ------------------------------------------------------------------------------------------------------------------ Invalid input(s): POCBNP  ---------------------------------------------------------------------------------------------------------------  Urinalysis    Component Value Date/Time   COLORURINE YELLOW 06/04/2018 1525   APPEARANCEUR Hazy (A) 04/28/2019 1109   LABSPEC 1.015 06/04/2018 1525   PHURINE 5.0 06/04/2018 1525   GLUCOSEU 2+ (A) 04/28/2019 1109   HGBUR TRACE (A) 06/04/2018 1525   BILIRUBINUR Negative 04/28/2019 1109   KETONESUR NEGATIVE 06/04/2018 1525   PROTEINUR 3+ (A) 04/28/2019 1109   PROTEINUR NEGATIVE 06/04/2018 1525   NITRITE Negative 04/28/2019 1109   NITRITE NEGATIVE 06/04/2018 1525   LEUKOCYTESUR Negative 04/28/2019 1109     RADIOLOGY: Dg Chest 1 View  Result Date: 04/28/2019 CLINICAL DATA:  Leukocytosis EXAM: CHEST  1 VIEW COMPARISON:  None. FINDINGS: Pacemaker leads are attached to the right atrium and right ventricle. Heart is borderline enlarged with pulmonary vascularity normal. There is aortic atherosclerosis. No lung edema or consolidation. No adenopathy. No bone lesions. There is calcification in each carotid artery with a stent in the left carotid artery, incompletely visualized. IMPRESSION: No edema or consolidation. Heart borderline enlarged with pacemaker leads attached to the right heart. Aortic Atherosclerosis  (ICD10-I70.0). Foci of carotid artery calcification noted with incomplete visualization of a left carotid stent. Electronically Signed   By: Lowella Grip III M.D.   On: 04/28/2019 14:47   Abdomen 1 View (kub)  Result Date: 04/28/2019 CLINICAL DATA:  Diffuse abdominal pain EXAM: ABDOMEN - 1 VIEW COMPARISON:  12/10/2018 FINDINGS: Scattered large and small bowel gas is noted. Left renal calculi are again identified and stable. No other definitive calculi are noted. Bilateral hip replacements are seen. Degenerative changes of lumbar spine are noted. IMPRESSION: Stable left renal calculi. Electronically Signed   By: Inez Catalina M.D.   On: 04/28/2019 14:39   Ct Angio Abd/pel W And/or Wo Contrast  Result Date: 04/28/2019 CLINICAL DATA:  Diffuse abdominal pain with nausea EXAM: CTA ABDOMEN AND PELVIS wITHOUT AND WITH CONTRAST TECHNIQUE: Multidetector CT imaging of the abdomen and pelvis was performed using the standard protocol during bolus administration of intravenous contrast. Multiplanar reconstructed images and MIPs were obtained and reviewed to evaluate the vascular anatomy. CONTRAST:  60mL OMNIPAQUE IOHEXOL 350 MG/ML SOLN COMPARISON:  04/13/2018, plain film from earlier in the same day. FINDINGS: VASCULAR Aorta: Abdominal aorta demonstrates atherosclerotic calcification without evidence of focal aneurysmal dilatation or dissection. Celiac: Celiac axis demonstrates focal soft atherosclerotic plaque at the origin with narrowing of approximately 50%. Minimal poststenotic dilatation is identified. SMA: Calcific  plaque is noted although no focal stenosis is seen. No filling defect to suggest mesenteric thrombosis is noted. Replaced right hepatic artery is noted. Renals: Dual renal arteries are noted on the left. The more inferior artery appears patent. Mild smooth atherosclerotic plaque is noted at the origin of the left renal artery with approximately 50% stenosis. Mild stenosis at the origin of the right  renal artery is noted. IMA: Patent without evidence of aneurysm, dissection, vasculitis or significant stenosis. Iliacs: Moderate stenosis is noted within the right external iliac artery. Mild atherosclerotic change in the leftCommon iliac artery is again identified. These changes are stable from the prior CT examination. Veins: No venous abnormality is noted. Review of the MIP images confirms the above findings. NON-VASCULAR Lower chest: Within normal limits. Hepatobiliary: Fatty infiltration of the liver is noted. The gallbladder is well distended. Some minimal pericholecystic inflammatory change is noted although no definitive wall thickening is seen. Ultrasound may be helpful for further evaluation. Pancreas: Unremarkable. No pancreatic ductal dilatation or surrounding inflammatory changes. Spleen: Normal in size without focal abnormality. Adrenals/Urinary Tract: Adrenal glands are within normal limits. Kidneys demonstrate a normal enhancement pattern bilaterally. Bilateral renal calculi are noted on the left in the lower pole stable from the prior study and a tiny stone in the right lower pole. Largest of these measures 11 mm in greatest dimension. No obstructive changes are seen. The bladder is decompressed evaluation is somewhat limited due to scatter artifact from bilateral hip replacements. Stomach/Bowel: Colon is well visualized. No evidence of significant inflammatory change or obstructive change is noted. The appendix is well visualized and within normal limits. No small bowel or gastric abnormality is seen. Lymphatic: No significant lymphatic abnormality is noted. Reproductive: Prostate is within normal limits although again obscured by scatter artifact. Other: No abdominal wall hernia or abnormality. No abdominopelvic ascites. Musculoskeletal: Degenerative changes of lumbar spine are noted. IMPRESSION: VASCULAR Mild stenosis of the celiac proximally. No evidence of mesenteric ischemia or thrombosis.  Atherosclerotic changes are seen with left renal artery narrowing as well as involvement of the iliac arteries bilaterally right slightly greater than left. These changes are overall stable from the prior exam. NON-VASCULAR Mild pericholecystic inflammatory change. Ultrasound may be helpful for further evaluation to rule out acute cholecystitis. Bilateral renal calculi similar to that seen on the prior exam. No obstructive changes are noted. Fatty liver. These findings were discussed with referring clinician at the time of exam interpretation. Electronically Signed   By: Inez Catalina M.D.   On: 04/28/2019 14:38   US Abdomen Limited Ruq  Result Date: 04/28/2019 CLINICAL DATA:  Diffuse abdominal pain with nausea since yesterday. EXAM: ULTRASOUND ABDOMEN LIMITED RIGHT UPPER QUADRANT COMPARISON:  CT 04/28/2019 FINDINGS: Gallbladder: Evidence of mild cholelithiasis as well as sludge. Minimal gallbladder wall thickening measuring 3-4 mm. Negative sonographic Murphy sign. No adjacent free fluid. Common bile duct: Diameter: 5.9 mm. Liver: Mild increased parenchymal echogenicity without focal mass. Portal vein is patent on color Doppler imaging with normal direction of blood flow towards the liver. IMPRESSION: Mild cholelithiasis and sludge with slight gallbladder wall thickening. Findings may be seen with acute cholecystitis as recommend clinical correlation. Mild hepatic steatosis without focal mass. Electronically Signed   By: Marin Olp M.D.   On: 04/28/2019 15:14    EKG: Orders placed or performed during the hospital encounter of 04/28/19  . ED EKG  . ED EKG    IMPRESSION AND PLAN: 63 year old male patient with a known history of aortic  valve replacement on Coumadin for anticoagulation, hypertension, hyperlipidemia, CVA, chronic congestive heart failure, type 2 diabetes mellitus, coronary artery disease, cardiac stent placement presented to the emergency room with right upper quadrant pain for the last  2 days.    -Acute cholecystitis Admit patient to medical floor N.p.o. except meds IV fluids IV Zosyn antibiotic Surgery consult  -History of aortic valve replacement Hold Coumadin patient might need surgery IV heparin drip for anticoagulation  -Chronic congestive heart failure Appears stable Avoid fluid overload Medical management  -History of diabetes mellitus Currently n.p.o. Sliding scale coverage with insulin  -Hyperlipidemia Continue statin medication  -DVT prophylaxis On heparin drip for anticoagulation  All the records are reviewed and case discussed with ED provider. Management plans discussed with the patient, family and they are in agreement.  CODE STATUS: Full code    Code Status Orders  (From admission, onward)         Start     Ordered   04/28/19 1740  Full code  Continuous     04/28/19 1739        Code Status History    Date Active Date Inactive Code Status Order ID Comments User Context   05/10/2018 1843 05/12/2018 1520 DNR [id number]  Gladstone Lighter, MD Inpatient   Advance Care Planning Activity    Advance Directive Documentation     Most Recent Value  Type of Advance Directive  Healthcare Power of Attorney, Living will  Pre-existing out of facility DNR order (yellow form or pink MOST form)  -  "MOST" Form in Place?  -       TOTAL TIME TAKING CARE OF THIS PATIENT: 52 minutes.    Saundra Shelling M.D on 04/28/2019 at 5:39 PM  Between 7am to 6pm - Pager - [phone]  After 6pm go to [email or web address] - password [email or web address]  Office  [phone]  CC: Primary care physician; Rusty Aus, MD

## 2019-04-28 NOTE — ED Notes (Addendum)
PT given cell phone and charger  Also requested MD to call pt wife, pt states she has attempted to call pt's wife but will try again

## 2019-04-28 NOTE — ED Notes (Signed)
Sent rainbow to lab. 

## 2019-04-28 NOTE — ED Notes (Signed)
Pt states unable to give urine sample

## 2019-04-28 NOTE — ED Notes (Signed)
Pt given ginger ale.

## 2019-04-29 ENCOUNTER — Inpatient Hospital Stay: Payer: Medicare Other

## 2019-04-29 LAB — COMPREHENSIVE METABOLIC PANEL
ALT: 11 U/L (ref 0–44)
AST: 16 U/L (ref 15–41)
Albumin: 3.5 g/dL (ref 3.5–5.0)
Alkaline Phosphatase: 74 U/L (ref 38–126)
Anion gap: 9 (ref 5–15)
BUN: 27 mg/dL — ABNORMAL HIGH (ref 8–23)
CO2: 24 mmol/L (ref 22–32)
Calcium: 8.9 mg/dL (ref 8.9–10.3)
Chloride: 103 mmol/L (ref 98–111)
Creatinine, Ser: 0.96 mg/dL (ref 0.61–1.24)
GFR calc Af Amer: >60 mL/min (ref >60)
GFR calc non Af Amer: >60 mL/min (ref >60)
Glucose, Bld: 185 mg/dL — ABNORMAL HIGH (ref 70–99)
Potassium: 4.1 mmol/L (ref 3.5–5.1)
Sodium: 136 mmol/L (ref 135–145)
Total Bilirubin: 1 mg/dL (ref 0.3–1.2)
Total Protein: 6.9 g/dL (ref 6.5–8.1)

## 2019-04-29 LAB — CBC
HCT: 42.2 % (ref 39.0–52.0)
Hemoglobin: 13.8 g/dL (ref 13.0–17.0)
MCH: 27.5 pg (ref 26.0–34.0)
MCHC: 32.7 g/dL (ref 30.0–36.0)
MCV: 84.1 fL (ref 80.0–100.0)
Platelets: 209 10*3/uL (ref 150–400)
RBC: 5.02 MIL/uL (ref 4.22–5.81)
RDW: 14.5 % (ref 11.5–15.5)
WBC: 22.2 10*3/uL — ABNORMAL HIGH (ref 4.0–10.5)
nRBC: 0 % (ref 0.0–0.2)

## 2019-04-29 LAB — PROTIME-INR
INR: 2.7 — ABNORMAL HIGH (ref 0.8–1.2)
Prothrombin Time: 28.1 seconds — ABNORMAL HIGH (ref 11.4–15.2)

## 2019-04-29 MED ORDER — PHYTONADIONE 5 MG PO TABS
5.0000 mg | ORAL_TABLET | Freq: Once | ORAL | Status: AC
  Administered 2019-04-29: 5 mg via ORAL
  Filled 2019-04-29: qty 1

## 2019-04-29 MED ORDER — SODIUM CHLORIDE 0.9 % IV SOLN
INTRAVENOUS | Status: DC | PRN
  Administered 2019-04-29 – 2019-05-02 (×4): 250 mL via INTRAVENOUS

## 2019-04-29 MED ORDER — TECHNETIUM TC 99M MEBROFENIN IV KIT
5.6300 | PACK | Freq: Once | INTRAVENOUS | Status: AC | PRN
  Administered 2019-04-29: 5.63 via INTRAVENOUS

## 2019-04-29 MED ORDER — TECHNETIUM TC 99M MEBROFENIN IV KIT
2.4200 | PACK | Freq: Once | INTRAVENOUS | Status: AC | PRN
  Administered 2019-04-29: 2.42 via INTRAVENOUS

## 2019-04-29 MED ORDER — ATORVASTATIN CALCIUM 20 MG PO TABS
40.0000 mg | ORAL_TABLET | Freq: Every day | ORAL | Status: DC
  Administered 2019-04-29 – 2019-05-01 (×3): 40 mg via ORAL
  Filled 2019-04-29 (×3): qty 2

## 2019-04-29 MED ORDER — KETOROLAC TROMETHAMINE 30 MG/ML IJ SOLN
30.0000 mg | Freq: Four times a day (QID) | INTRAMUSCULAR | Status: DC | PRN
  Administered 2019-04-29: 30 mg via INTRAVENOUS
  Filled 2019-04-29: qty 1

## 2019-04-29 MED ORDER — MORPHINE SULFATE (PF) 4 MG/ML IV SOLN
3.5000 mg | Freq: Once | INTRAVENOUS | Status: AC
  Administered 2019-04-29: 3.5 mg via INTRAVENOUS
  Filled 2019-04-29: qty 1

## 2019-04-29 NOTE — Progress Notes (Signed)
[place] [facility] [place] NAME: Michael Patrick    MR#:  [id number]  DATE OF BIRTH:  1956-09-15  SUBJECTIVE:   Patient admitted to the hospital due to diffuse abdominal pain and suspected to have acute cholecystitis.  Plan for HIDA scan today.  Patient still complaining of generalized abdominal pain.  REVIEW OF SYSTEMS:    Review of Systems  Constitutional: Negative for chills and fever.  HENT: Negative for congestion and tinnitus.   Eyes: Negative for blurred vision and double vision.  Respiratory: Negative for cough, shortness of breath and wheezing.   Cardiovascular: Negative for chest pain, orthopnea and PND.  Gastrointestinal: Positive for abdominal pain. Negative for diarrhea, nausea and vomiting.  Genitourinary: Negative for dysuria and hematuria.  Neurological: Negative for dizziness, sensory change and focal weakness.  All other systems reviewed and are negative.   Nutrition: NPO Tolerating Diet: No Tolerating PT: Await Eval.   DRUG ALLERGIES:   Allergies  Allergen Reactions   Insulins Other (See Comments)    VITALS:  Blood pressure (!) 141/81, pulse 70, temperature 97.8 F (36.6 C), temperature source Oral, resp. rate 20, height 5\' 7"  (1.702 m), weight 88.5 kg, SpO2 97 %.  PHYSICAL EXAMINATION:   Physical Exam  GENERAL:  63 y.o.-year-old patient lying in bed in no acute distress.  EYES: Pupils equal, round, reactive to light and accommodation. No scleral icterus. Extraocular muscles intact.  HEENT: Head atraumatic, normocephalic. Oropharynx and nasopharynx clear.  NECK:  Supple, no jugular venous distention. No thyroid enlargement, no tenderness.  LUNGS: Normal breath sounds bilaterally, no wheezing, rales, rhonchi. No use of accessory muscles of respiration.  CARDIOVASCULAR: S1, S2 RRR , + metallic valve click . No murmurs, rubs, or gallops.  ABDOMEN: Soft, Tender diffusely, no rebound, rigidity, nondistended. Bowel  sounds present. No organomegaly or mass.  EXTREMITIES: No cyanosis, clubbing or edema b/l.    NEUROLOGIC: Cranial nerves II through XII are intact. No focal Motor or sensory deficits b/l.   PSYCHIATRIC: The patient is alert and oriented x 3.  SKIN: No obvious rash, lesion, or ulcer.    LABORATORY PANEL:   CBC Recent Labs  Lab 04/29/19 0716  WBC 22.2*  HGB 13.8  HCT 42.2  PLT 209   ------------------------------------------------------------------------------------------------------------------  Chemistries  Recent Labs  Lab 04/29/19 0716  NA 136  K 4.1  CL 103  CO2 24  GLUCOSE 185*  BUN 27*  CREATININE 0.96  CALCIUM 8.9  AST 16  ALT 11  ALKPHOS 74  BILITOT 1.0   ------------------------------------------------------------------------------------------------------------------  Cardiac Enzymes No results for input(s): TROPONINI in the last 168 hours. ------------------------------------------------------------------------------------------------------------------  RADIOLOGY:  Dg Chest 1 View  Result Date: 04/28/2019 CLINICAL DATA:  Leukocytosis EXAM: CHEST  1 VIEW COMPARISON:  None. FINDINGS: Pacemaker leads are attached to the right atrium and right ventricle. Heart is borderline enlarged with pulmonary vascularity normal. There is aortic atherosclerosis. No lung edema or consolidation. No adenopathy. No bone lesions. There is calcification in each carotid artery with a stent in the left carotid artery, incompletely visualized. IMPRESSION: No edema or consolidation. Heart borderline enlarged with pacemaker leads attached to the right heart. Aortic Atherosclerosis (ICD10-I70.0). Foci of carotid artery calcification noted with incomplete visualization of a left carotid stent. Electronically Signed   By: Lowella Grip III M.D.   On: 04/28/2019 14:47   Abdomen 1 View (kub)  Result Date: 04/28/2019 CLINICAL DATA:  Diffuse abdominal pain EXAM: ABDOMEN - 1 VIEW  COMPARISON:  12/10/2018 FINDINGS: Scattered large and small bowel gas is noted. Left renal calculi are again identified and stable. No other definitive calculi are noted. Bilateral hip replacements are seen. Degenerative changes of lumbar spine are noted. IMPRESSION: Stable left renal calculi. Electronically Signed   By: Inez Catalina M.D.   On: 04/28/2019 14:39   Ct Angio Abd/pel W And/or Wo Contrast  Result Date: 04/28/2019 CLINICAL DATA:  Diffuse abdominal pain with nausea EXAM: CTA ABDOMEN AND PELVIS wITHOUT AND WITH CONTRAST TECHNIQUE: Multidetector CT imaging of the abdomen and pelvis was performed using the standard protocol during bolus administration of intravenous contrast. Multiplanar reconstructed images and MIPs were obtained and reviewed to evaluate the vascular anatomy. CONTRAST:  66mL OMNIPAQUE IOHEXOL 350 MG/ML SOLN COMPARISON:  04/13/2018, plain film from earlier in the same day. FINDINGS: VASCULAR Aorta: Abdominal aorta demonstrates atherosclerotic calcification without evidence of focal aneurysmal dilatation or dissection. Celiac: Celiac axis demonstrates focal soft atherosclerotic plaque at the origin with narrowing of approximately 50%. Minimal poststenotic dilatation is identified. SMA: Calcific plaque is noted although no focal stenosis is seen. No filling defect to suggest mesenteric thrombosis is noted. Replaced right hepatic artery is noted. Renals: Dual renal arteries are noted on the left. The more inferior artery appears patent. Mild smooth atherosclerotic plaque is noted at the origin of the left renal artery with approximately 50% stenosis. Mild stenosis at the origin of the right renal artery is noted. IMA: Patent without evidence of aneurysm, dissection, vasculitis or significant stenosis. Iliacs: Moderate stenosis is noted within the right external iliac artery. Mild atherosclerotic change in the leftCommon iliac artery is again identified. These changes are stable from the  prior CT examination. Veins: No venous abnormality is noted. Review of the MIP images confirms the above findings. NON-VASCULAR Lower chest: Within normal limits. Hepatobiliary: Fatty infiltration of the liver is noted. The gallbladder is well distended. Some minimal pericholecystic inflammatory change is noted although no definitive wall thickening is seen. Ultrasound may be helpful for further evaluation. Pancreas: Unremarkable. No pancreatic ductal dilatation or surrounding inflammatory changes. Spleen: Normal in size without focal abnormality. Adrenals/Urinary Tract: Adrenal glands are within normal limits. Kidneys demonstrate a normal enhancement pattern bilaterally. Bilateral renal calculi are noted on the left in the lower pole stable from the prior study and a tiny stone in the right lower pole. Largest of these measures 11 mm in greatest dimension. No obstructive changes are seen. The bladder is decompressed evaluation is somewhat limited due to scatter artifact from bilateral hip replacements. Stomach/Bowel: Colon is well visualized. No evidence of significant inflammatory change or obstructive change is noted. The appendix is well visualized and within normal limits. No small bowel or gastric abnormality is seen. Lymphatic: No significant lymphatic abnormality is noted. Reproductive: Prostate is within normal limits although again obscured by scatter artifact. Other: No abdominal wall hernia or abnormality. No abdominopelvic ascites. Musculoskeletal: Degenerative changes of lumbar spine are noted. IMPRESSION: VASCULAR Mild stenosis of the celiac proximally. No evidence of mesenteric ischemia or thrombosis. Atherosclerotic changes are seen with left renal artery narrowing as well as involvement of the iliac arteries bilaterally right slightly greater than left. These changes are overall stable from the prior exam. NON-VASCULAR Mild pericholecystic inflammatory change. Ultrasound may be helpful for further  evaluation to rule out acute cholecystitis. Bilateral renal calculi similar to that seen on the prior exam. No obstructive changes are noted. Fatty liver. These findings were discussed with referring clinician at the time of exam  interpretation. Electronically Signed   By: Inez Catalina M.D.   On: 04/28/2019 14:38   US Abdomen Limited Ruq  Result Date: 04/28/2019 CLINICAL DATA:  Diffuse abdominal pain with nausea since yesterday. EXAM: ULTRASOUND ABDOMEN LIMITED RIGHT UPPER QUADRANT COMPARISON:  CT 04/28/2019 FINDINGS: Gallbladder: Evidence of mild cholelithiasis as well as sludge. Minimal gallbladder wall thickening measuring 3-4 mm. Negative sonographic Murphy sign. No adjacent free fluid. Common bile duct: Diameter: 5.9 mm. Liver: Mild increased parenchymal echogenicity without focal mass. Portal vein is patent on color Doppler imaging with normal direction of blood flow towards the liver. IMPRESSION: Mild cholelithiasis and sludge with slight gallbladder wall thickening. Findings may be seen with acute cholecystitis as recommend clinical correlation. Mild hepatic steatosis without focal mass. Electronically Signed   By: Marin Olp M.D.   On: 04/28/2019 15:14     ASSESSMENT AND PLAN:   63 year old male with past medical history of hypertension, hyperlipidemia, diabetes, CHF, history of previous CVA, carotid artery occlusion, hx of st. Jude's aortic valve who presented to the hospital due to abdominal pain and suspected to have acute cholecystitis.  1.  Acute cholecystitis-this is suspected diagnosis given patient's abdominal pain and ultrasound findings. - Seen by general surgery and they want to confirm it by doing a HIDA scan.  Plan for HIDA scan today. - Continue empiric antibiotics with Zosyn. - Possible need for laparoscopic cholecystectomy tomorrow.  2.  History of atrial fibrillation- rate controlled. -Continue carvedilol, continue Tikosyn.  Coumadin on hold as patient likely to have  surgery tomorrow.  Patient to be initiated on heparin drip once INR drifts down.  3.  Acquired coagulopathy-secondary to the Coumadin. - INR today is 2.7.  Will give 5 of vitamin K.  Repeat INR in the morning.  4.  History of St. Jude's aortic valve- patient is on Coumadin but coagulopathy needs to be reversed for surgery. - We will give vitamin K, and place on heparin gtt later.   5.  Hyperlipidemia-continue atorvastatin.  6.  BPH- continue finasteride.   All the records are reviewed and case discussed with Care Management/Social Worker. Management plans discussed with the patient, family and they are in agreement.  CODE STATUS: Full code  DVT Prophylaxis: Coumadin/heparin gtt  TOTAL TIME TAKING CARE OF THIS PATIENT: 30 minutes.   POSSIBLE D/C IN 2-3 DAYS, DEPENDING ON CLINICAL CONDITION.   Henreitta Leber M.D on 04/29/2019 at 2:31 PM  Between 7am to 6pm - Pager - [phone]  After 6pm go to [email or web address] - Proofreader  [facility]  Office  [phone]  CC: Primary care physician; Rusty Aus, MD

## 2019-04-29 NOTE — Progress Notes (Addendum)
ANTICOAGULATION CONSULT NOTE - Initial Consult  Pharmacy Consult for Heparin Drip Indication: Transition from Warfarin in anticipation of possible surgery  Allergies  Allergen Reactions  . Insulins Other (See Comments)    Patient Measurements: Height: 5\' 7"  (170.2 cm) Weight: 195 lb (88.5 kg) IBW/kg (Calculated) : 66.1 Heparin Dosing Weight:  84.5 kg   Vital Signs: Temp: 97.4 F (36.3 C) (07/17 0537) Temp Source: Oral (07/17 0537) BP: 135/77 (07/17 0537) Pulse Rate: 70 (07/17 0537)  Labs: Recent Labs    04/28/19 1139 04/28/19 1236 04/29/19 0716  HGB 15.9  --  13.8  HCT 48.9  --  42.2  PLT 276  --  209  APTT  --  53*  --   LABPROT  --  26.9* 28.1*  INR  --  2.5* 2.7*  CREATININE 1.06  --  0.96    Estimated Creatinine Clearance: 83.7 mL/min (by C-G formula based on SCr of 0.96 mg/dL).   Medical History: Past Medical History:  Diagnosis Date  . Carotid artery occlusion   . CHF (congestive heart failure) ([place])   . Diabetes mellitus without complication ([place])   . Hyperlipidemia   . Hypertension   . Stroke [facility])     Medications:  Scheduled:  . atorvastatin  80 mg Oral q1800  . carvedilol  12.5 mg Oral BID WC  . dofetilide  500 mcg Oral BID  . finasteride  5 mg Oral Daily  . gemfibrozil  600 mg Oral BID AC   Infusions:  . sodium chloride 75 mL/hr at 04/29/19 0459  . piperacillin-tazobactam (ZOSYN)  IV 3.375 g (04/29/19 0519)    Assessment: 63 yo M on Warfarin for Hx Afib, Aortic Valve replacement. To be transitioned to Heparin drip.    Goal INR per Cardiology notes= 2.0-3.0 On Warfarin 5 mg daily PTA per Med Rec Hgb 15.9  Plt 276  INR 2.5 >> 2.7  Goal of Therapy:  Heparin level 0.3-0.7 units/ml Monitor platelets by anticoagulation protocol: Yes   Plan:  Will plan to start Heparin drip, with no bolus, when INR </= 2.0.   Infusion rate of 1150 units/hr will be ordered, when appropriate.   Ordered vitamin K 5 mg x1 dose, as discussed with MD  during rounds, to lower INR and recheck with AM labs.   Will check INR with am labs.   Rowland Lathe 04/29/2019,7:54 AM

## 2019-04-29 NOTE — Progress Notes (Addendum)
Subjective:  CC: Michael Patrick is a 63 y.o. male  Hospital stay day 1,   acute cholecystitis  HPI: Feeling slightly better today after IV meds.  ROS:  General: Denies weight loss, weight gain, fatigue, fevers, chills, and night sweats. Heart: Denies chest pain, palpitations, racing heart, irregular heartbeat, leg pain or swelling, and decreased activity tolerance. Respiratory: Denies breathing difficulty, shortness of breath, wheezing, cough, and sputum. GI: Denies change in appetite, heartburn, nausea, vomiting, constipation, diarrhea, and blood in stool. GU: Denies difficulty urinating, pain with urinating, urgency, frequency, blood in urine.   Objective:   Temp:  [97.4 F (36.3 C)-99.5 F (37.5 C)] 97.8 F (36.6 C) (07/17 1226) Pulse Rate:  [70-72] 70 (07/17 1226) Resp:  [15-20] 20 (07/17 0537) BP: (131-143)/(71-81) 141/81 (07/17 1226) SpO2:  [96 %-100 %] 97 % (07/17 1226)     Height: 5\' 7"  (170.2 cm) Weight: 88.5 kg BMI (Calculated): 30.53   Intake/Output this shift:   Intake/Output Summary (Last 24 hours) at 04/29/2019 1612 Last data filed at 04/29/2019 1230 Gross per 24 hour  Intake 672.94 ml  Output 275 ml  Net 397.94 ml    Constitutional :  alert, cooperative, appears stated age and no distress  Respiratory:  clear to auscultation bilaterally  Cardiovascular:  irregularly irregular rhythm  Gastrointestinal: soft, slight guarding in RUQ, epigastric region, no major change from prior exam.   Skin: Cool and moist.   Psychiatric: Normal affect, non-agitated, not confused       LABS:  CMP Latest Ref Rng & Units 04/29/2019 04/28/2019 05/11/2018  Glucose 70 - 99 mg/dL 185(H) 318(H) 418(H)  BUN 8 - 23 mg/dL 27(H) 19 30(H)  Creatinine 0.61 - 1.24 mg/dL 0.96 1.06 1.45(H)  Sodium 135 - 145 mmol/L 136 135 135  Potassium 3.5 - 5.1 mmol/L 4.1 4.8 4.4  Chloride 98 - 111 mmol/L 103 97(L) 102  CO2 22 - 32 mmol/L 24 26 27   Calcium 8.9 - 10.3 mg/dL 8.9 9.7 8.2(L)  Total  Protein 6.5 - 8.1 g/dL 6.9 7.9 -  Total Bilirubin 0.3 - 1.2 mg/dL 1.0 1.0 -  Alkaline Phos 38 - 126 U/L 74 104 -  AST 15 - 41 U/L 16 19 -  ALT 0 - 44 U/L 11 15 -   CBC Latest Ref Rng & Units 04/29/2019 04/28/2019 05/12/2018  WBC 4.0 - 10.5 K/uL 22.2(H) 19.8(H) -  Hemoglobin 13.0 - 17.0 g/dL 13.8 15.9 10.2(L)  Hematocrit 39.0 - 52.0 % 42.2 48.9 -  Platelets 150 - 400 K/uL 209 276 -    RADS: CLINICAL DATA:  Right upper quadrant pain.  EXAM: NUCLEAR MEDICINE HEPATOBILIARY IMAGING  TECHNIQUE: Sequential images of the abdomen were obtained out to 120 minutes following intravenous administration of radiopharmaceutical. 3.5 mg of IV morphine were administered after 90 minutes.  RADIOPHARMACEUTICALS:  8.05 mCi Tc-1m  Choletec IV  COMPARISON:  Right upper quadrant ultrasound and CT abdomen pelvis from yesterday.  FINDINGS: Prompt uptake and biliary excretion of activity by the liver is seen. Gallbladder activity is not visualized, consistent with obstructed cystic duct. Biliary activity passes into small bowel, consistent with patent common bile duct.  IMPRESSION: 1. Findings consistent with acute cholecystitis.   Electronically Signed   By: Titus Dubin M.D.   On: 04/29/2019 15:34  Assessment:   Acute cholecystitis Discussed the risk of surgery including post-op infxn, seroma, biloma, chronic pain, poor-delayed wound healing, retained gallstone, conversion to open procedure, post-op SBO or ileus, and need for additional  procedures to address said risks.  The risks of general anesthetic including MI, CVA, sudden death or even reaction to anesthetic medications also discussed. Alternatives include continued observation.  Benefits include possible symptom relief, prevention of complications including acute cholecystitis, pancreatitis.  Typical post operative recovery of 3-5 days rest, continued pain in area and incision sites, possible loose stools up to 4-6 weeks, also  discussed.  The patient understands the risks, any and all questions were answered to the patient's satisfaction.  - hopefully lap chole tomorrow with Dr. Peyton Najjar, covering provider, when INR is 1.4 or less.  Hold heparin, lovenox, and toradol in preparation for surgery.  If INR is too high tomorrow, will consider cholecystostomy tube placement and interval lap chole as an outpt. -Case discussed with patient and primary, all in agreement with plan.  Continue home meds per primary team.

## 2019-04-29 NOTE — Progress Notes (Signed)
Pharmacy Antibiotic Note  Michael Patrick is a 63 y.o. male admitted on 04/28/2019 with Intra-abdominal infection.  Pharmacy has been consulted for Zosyn dosing. ?poss acute cholecystitis.  D#2: Zosyn   Plan: Zosyn 3.375g IV q8h (4 hour infusion).  Height: 5\' 7"  (170.2 cm) Weight: 195 lb (88.5 kg) IBW/kg (Calculated) : 66.1  Temp (24hrs), Avg:98.7 F (37.1 C), Min:97.4 F (36.3 C), Max:99.5 F (37.5 C)  Recent Labs  Lab 04/28/19 1139 04/28/19 1236 04/28/19 1535 04/29/19 0716  WBC 19.8*  --   --  22.2*  CREATININE 1.06  --   --  0.96  LATICACIDVEN  --  2.0* 1.7  --     Estimated Creatinine Clearance: 83.7 mL/min (by C-G formula based on SCr of 0.96 mg/dL).    Allergies  Allergen Reactions  . Insulins Other (See Comments)    Antimicrobials this admission: Zosyn  7/16 >>   Vanc x 1 in ER 7/16    Microbiology results: 7/16 BCx: NGTD   MRSA PCR:  Negative   Thank you for allowing pharmacy to be a part of this patient's care.  Rowland Lathe 04/29/2019 8:27 AM

## 2019-04-29 NOTE — Progress Notes (Signed)
Inpatient Diabetes Program Recommendations  AACE/ADA: New Consensus Statement on Inpatient Glycemic Control (2015)  Target Ranges:  Prepandial:   less than 140 mg/dL      Peak postprandial:   less than 180 mg/dL (1-2 hours)      Critically ill patients:  140 - 180 mg/dL   Results for JIN, CAPOTE (MRN [id number]) as of 04/29/2019 09:32  Ref. Range 04/28/2019 11:39 04/29/2019 07:16  Glucose Latest Ref Range: 70 - 99 mg/dL 318 (H) 185 (H)    Admit with: Acute cholecystitis  History: DM, CVA, CHF  Home DM Meds: Amaryl 4 mg BID  Current Orders: None     MD- Patient with History of Diabetes.  Currently NPO.    No orders for CBG checks or Novolog SSI.  Please consider placing orders for Novolog Moderate Correction Scale/ SSI (0-15 units) Q4 hours     --Will follow patient during hospitalization--  Wyn Quaker RN, MSN, CDE Diabetes Coordinator Inpatient Glycemic Control Team Team Pager: [phone] (8a-5p)

## 2019-04-29 NOTE — Progress Notes (Signed)
Patient refused to have cardiac telemetry. Patient educated.  MD made aware. Will continue to monitor.

## 2019-04-30 ENCOUNTER — Encounter
Admission: EM | Disposition: A | Discharge status: Home / Self Care | Payer: Self-pay | Source: Home / Self Care | Attending: Internal Medicine

## 2019-04-30 LAB — HEPATIC FUNCTION PANEL
ALT: 12 U/L (ref 0–44)
AST: 24 U/L (ref 15–41)
Albumin: 3.5 g/dL (ref 3.5–5.0)
Alkaline Phosphatase: 81 U/L (ref 38–126)
Bilirubin, Direct: 0.3 mg/dL — ABNORMAL HIGH (ref 0.0–0.2)
Indirect Bilirubin: 0.7 mg/dL (ref 0.3–0.9)
Total Bilirubin: 1 mg/dL (ref 0.3–1.2)
Total Protein: 7 g/dL (ref 6.5–8.1)

## 2019-04-30 LAB — BASIC METABOLIC PANEL
Anion gap: 10 (ref 5–15)
BUN: 32 mg/dL — ABNORMAL HIGH (ref 8–23)
CO2: 25 mmol/L (ref 22–32)
Calcium: 8.8 mg/dL — ABNORMAL LOW (ref 8.9–10.3)
Chloride: 101 mmol/L (ref 98–111)
Creatinine, Ser: 1.06 mg/dL (ref 0.61–1.24)
GFR calc Af Amer: >60 mL/min (ref >60)
GFR calc non Af Amer: >60 mL/min (ref >60)
Glucose, Bld: 93 mg/dL (ref 70–99)
Potassium: 4.2 mmol/L (ref 3.5–5.1)
Sodium: 136 mmol/L (ref 135–145)

## 2019-04-30 LAB — PROTIME-INR
INR: 1.7 — ABNORMAL HIGH (ref 0.8–1.2)
Prothrombin Time: 20 seconds — ABNORMAL HIGH (ref 11.4–15.2)

## 2019-04-30 LAB — CBC
HCT: 43.1 % (ref 39.0–52.0)
Hemoglobin: 13.9 g/dL (ref 13.0–17.0)
MCH: 27.5 pg (ref 26.0–34.0)
MCHC: 32.3 g/dL (ref 30.0–36.0)
MCV: 85.2 fL (ref 80.0–100.0)
Platelets: 202 10*3/uL (ref 150–400)
RBC: 5.06 MIL/uL (ref 4.22–5.81)
RDW: 14.6 % (ref 11.5–15.5)
WBC: 20.6 10*3/uL — ABNORMAL HIGH (ref 4.0–10.5)
nRBC: 0 % (ref 0.0–0.2)

## 2019-04-30 LAB — MAGNESIUM: Magnesium: 2.4 mg/dL (ref 1.7–2.4)

## 2019-04-30 LAB — PHOSPHORUS: Phosphorus: 2.9 mg/dL (ref 2.5–4.6)

## 2019-04-30 LAB — HEPARIN LEVEL (UNFRACTIONATED)
Heparin Unfractionated: 0.14 IU/mL — ABNORMAL LOW (ref 0.30–0.70)
Heparin Unfractionated: 0.42 IU/mL (ref 0.30–0.70)

## 2019-04-30 SURGERY — LAPAROSCOPIC CHOLECYSTECTOMY
Anesthesia: General

## 2019-04-30 MED ORDER — OXYCODONE-ACETAMINOPHEN 5-325 MG PO TABS
1.0000 | ORAL_TABLET | Freq: Four times a day (QID) | ORAL | Status: DC | PRN
  Administered 2019-04-30: 2 via ORAL
  Administered 2019-04-30: 1 via ORAL
  Filled 2019-04-30 (×2): qty 2

## 2019-04-30 MED ORDER — DIAZEPAM 5 MG PO TABS
5.0000 mg | ORAL_TABLET | Freq: Three times a day (TID) | ORAL | Status: DC | PRN
  Administered 2019-04-30 – 2019-05-04 (×10): 5 mg via ORAL
  Filled 2019-04-30 (×10): qty 1

## 2019-04-30 MED ORDER — HYDROMORPHONE HCL 1 MG/ML IJ SOLN
1.0000 mg | INTRAMUSCULAR | Status: DC | PRN
  Administered 2019-05-02 – 2019-05-03 (×2): 1 mg via INTRAVENOUS
  Administered 2019-05-03: 0.5 mg via INTRAVENOUS
  Administered 2019-05-03 – 2019-05-04 (×3): 1 mg via INTRAVENOUS
  Filled 2019-04-30 (×6): qty 1

## 2019-04-30 MED ORDER — OXYCODONE-ACETAMINOPHEN 5-325 MG PO TABS
1.0000 | ORAL_TABLET | Freq: Three times a day (TID) | ORAL | Status: DC | PRN
  Administered 2019-04-30: 2 via ORAL
  Filled 2019-04-30: qty 2

## 2019-04-30 MED ORDER — HEPARIN BOLUS VIA INFUSION
2500.0000 [IU] | Freq: Once | INTRAVENOUS | Status: AC
  Administered 2019-04-30: 2500 [IU] via INTRAVENOUS
  Filled 2019-04-30: qty 2500

## 2019-04-30 MED ORDER — HEPARIN (PORCINE) 25000 UT/250ML-% IV SOLN
1350.0000 [IU]/h | INTRAVENOUS | Status: DC
  Administered 2019-04-30 – 2019-05-01 (×2): 1350 [IU]/h via INTRAVENOUS
  Filled 2019-04-30 (×2): qty 250

## 2019-04-30 MED ORDER — HEPARIN (PORCINE) 25000 UT/250ML-% IV SOLN
1150.0000 [IU]/h | INTRAVENOUS | Status: DC
  Filled 2019-04-30: qty 250

## 2019-04-30 MED ORDER — HEPARIN (PORCINE) 25000 UT/250ML-% IV SOLN
1150.0000 [IU]/h | INTRAVENOUS | Status: DC
  Administered 2019-04-30: 1150 [IU]/h via INTRAVENOUS

## 2019-04-30 MED ORDER — HEPARIN (PORCINE) 25000 UT/250ML-% IV SOLN: 1150.0000 [IU]/h | INTRAVENOUS | Status: DC

## 2019-04-30 MED ORDER — BUPIVACAINE-EPINEPHRINE (PF) 0.5% -1:200000 IJ SOLN
INTRAMUSCULAR | Status: AC
  Filled 2019-04-30: qty 30

## 2019-04-30 NOTE — Progress Notes (Addendum)
[place] Hospital Day(s): 2.   Post op day(s):  Marland Kitchen   Interval History: Patient seen and examined.  Patient continue to complain of abdominal pain.  Denies fever or chills.  Denies diarrhea.  Patient today's INR is 1.7, still elevated for this type of surgery.  Vital signs in last 24 hours: [min-max] current  Temp:  [97.8 F (36.6 C)-99.3 F (37.4 C)] 99.3 F (37.4 C) (07/18 0623) Pulse Rate:  [70-72] 70 (07/18 0623) Resp:  [20] 20 (07/18 0623) BP: (131-157)/(71-81) 157/78 (07/18 0623) SpO2:  [97 %] 97 % (07/18 0623)     Height: 5\' 7"  (170.2 cm) Weight: 88.5 kg BMI (Calculated): 30.53   Physical Exam:  Constitutional: alert, cooperative and no distress  Gastrointestinal: soft, tender on right upper quadrant, and non-distended  Labs:  CBC Latest Ref Rng & Units 04/30/2019 04/29/2019 04/28/2019  WBC 4.0 - 10.5 K/uL 20.6(H) 22.2(H) 19.8(H)  Hemoglobin 13.0 - 17.0 g/dL 13.9 13.8 15.9  Hematocrit 39.0 - 52.0 % 43.1 42.2 48.9  Platelets 150 - 400 K/uL 202 209 276   CMP Latest Ref Rng & Units 04/30/2019 04/29/2019 04/28/2019  Glucose 70 - 99 mg/dL 93 185(H) 318(H)  BUN 8 - 23 mg/dL 32(H) 27(H) 19  Creatinine 0.61 - 1.24 mg/dL 1.06 0.96 1.06  Sodium 135 - 145 mmol/L 136 136 135  Potassium 3.5 - 5.1 mmol/L 4.2 4.1 4.8  Chloride 98 - 111 mmol/L 101 103 97(L)  CO2 22 - 32 mmol/L 25 24 26   Calcium 8.9 - 10.3 mg/dL 8.8(L) 8.9 9.7  Total Protein 6.5 - 8.1 g/dL 7.0 6.9 7.9  Total Bilirubin 0.3 - 1.2 mg/dL 1.0 1.0 1.0  Alkaline Phos 38 - 126 U/L 81 74 104  AST 15 - 41 U/L 24 16 19   ALT 0 - 44 U/L 12 11 15    INR 1.7  Imaging studies: No new pertinent imaging studies   Assessment/Plan:  63 y.o. male with acute cholecystitis, complicated by pertinent comorbidities including multiple replacement on Coumadin, chronic congestive heart failure, history of CVA, type 2 diabetes mellitus, coronary artery disease status post stent placement. Patient needed with the diagnosis of  acute cholecystitis has been trying to be optimized for surgery.  Patient arrived with elevated INR.  Despite discontinuing the Coumadin, no hep drip and given vitamin K the INR today still 1.7.  Discussion was taken yesterday with patient that surgery was going to be if INR is 1.4 or less.  After discussing with him today about still having elevated INR patient question of why I cannot do a surgery with 1.7 of INR instead of 1.4.  I discussed with him the increased risk of bleeding during the surgery and his answer was that "bleeding is fine with me."  Patient was or expressed that she is getting tired of all this the things are happening to him.  He denies any suicidal or self-harm thoughts.  This patient has been more than 5 days of onset of pain, still with increased INR, and with multiple severe, serious chronic medical conditions I think that the surgery is too risky for him at this moment and I considered that draining the gallbladder will be safer for him and will be able to control his pain and his sepsis. I called interventional radiology I discussed the case with them.  I discussed the case with Dr. Posey Pronto and they have my recommendation of percutaneous cholecystostomy.  We will continue to follow patient while he is  inpatient for assistant in the management of cholecystitis.  Arnold Long, MD

## 2019-04-30 NOTE — Progress Notes (Signed)
ANTICOAGULATION CONSULT NOTE - Initial Consult  Pharmacy Consult for Heparin Drip Indication: Transition from Warfarin in anticipation of possible surgery  Allergies  Allergen Reactions  . Insulins Other (See Comments)    Patient Measurements: Height: 5\' 7"  (170.2 cm) Weight: 195 lb (88.5 kg) IBW/kg (Calculated) : 66.1 Heparin Dosing Weight:  84.5 kg   Vital Signs: Temp: 98.7 F (37.1 C) (07/18 1242) Temp Source: Oral (07/18 1242) BP: 130/66 (07/18 1242) Pulse Rate: 70 (07/18 1242)  Labs: Recent Labs    04/28/19 1139 04/28/19 1236 04/29/19 0716 04/30/19 0302 04/30/19 1221  HGB 15.9  --  13.8 13.9  --   HCT 48.9  --  42.2 43.1  --   PLT 276  --  209 202  --   APTT  --  53*  --   --   --   LABPROT  --  26.9* 28.1* 20.0*  --   INR  --  2.5* 2.7* 1.7*  --   HEPARINUNFRC  --   --   --   --  0.14*  CREATININE 1.06  --  0.96 1.06  --     Estimated Creatinine Clearance: 75.8 mL/min (by C-G formula based on SCr of 1.06 mg/dL).   Medical History: Past Medical History:  Diagnosis Date  . Carotid artery occlusion   . CHF (congestive heart failure) ([place])   . Diabetes mellitus without complication ([place])   . Hyperlipidemia   . Hypertension   . Stroke [facility])     Medications:  Scheduled:  . atorvastatin  40 mg Oral q1800  . carvedilol  12.5 mg Oral BID WC  . dofetilide  500 mcg Oral BID  . finasteride  5 mg Oral Daily  . gemfibrozil  600 mg Oral BID AC   Infusions:  . sodium chloride 75 mL/hr at 04/30/19 1238  . sodium chloride Stopped (04/30/19 1238)  . heparin    . piperacillin-tazobactam (ZOSYN)  IV 3.375 g (04/30/19 1238)    Assessment: Pharmacy consulted for heparin drip management for 63 yo male admitted with acute cholecystitis and planned surgical intervention early next week. Patient takes warfarin due to atrial fibrillation as an outpatient. INR is 1.7 today and patient has been initiated on heparin infusion at 1150 units/hr.  Goal of Therapy:   Heparin level 0.3-0.7 units/ml Monitor platelets by anticoagulation protocol: Yes   Plan:  Will bolus heparin 2500 units and increase rate to 1350 units/hr. Will obtain next anti-Xa level at 2000.   Pharmacy will continue to monitor and adjust per consult.   MLS 04/30/2019,1:56 PM

## 2019-04-30 NOTE — Progress Notes (Signed)
Patient ID: Michael Patrick, male   DOB: 1956/07/12, 63 y.o.   MRN: [id number]   Pt is scheduled for Cholecystostomy drain placement in IR today NPO orders in place  Stop Hep drip at 845 am INR 1.7-- Dr Vernard Gambles aware  IR will call for pt when can  Consents for self per RN

## 2019-04-30 NOTE — Progress Notes (Signed)
ANTICOAGULATION CONSULT NOTE - Initial Consult  Pharmacy Consult for Heparin Drip Indication: Transition from Warfarin in anticipation of possible surgery  Allergies  Allergen Reactions  . Insulins Other (See Comments)    Patient Measurements: Height: 5\' 7"  (170.2 cm) Weight: 195 lb (88.5 kg) IBW/kg (Calculated) : 66.1 Heparin Dosing Weight:  84.5 kg   Vital Signs: Temp: 98.9 F (37.2 C) (07/17 2133) Temp Source: Oral (07/17 2133) BP: 138/78 (07/17 2133) Pulse Rate: 70 (07/17 2133)  Labs: Recent Labs    04/28/19 1139 04/28/19 1236 04/29/19 0716 04/30/19 0302  HGB 15.9  --  13.8 13.9  HCT 48.9  --  42.2 43.1  PLT 276  --  209 202  APTT  --  53*  --   --   LABPROT  --  26.9* 28.1* 20.0*  INR  --  2.5* 2.7* 1.7*  CREATININE 1.06  --  0.96 1.06    Estimated Creatinine Clearance: 75.8 mL/min (by C-G formula based on SCr of 1.06 mg/dL).   Medical History: Past Medical History:  Diagnosis Date  . Carotid artery occlusion   . CHF (congestive heart failure) ([place])   . Diabetes mellitus without complication ([place])   . Hyperlipidemia   . Hypertension   . Stroke [facility])     Medications:  Scheduled:  . atorvastatin  40 mg Oral q1800  . carvedilol  12.5 mg Oral BID WC  . dofetilide  500 mcg Oral BID  . finasteride  5 mg Oral Daily  . gemfibrozil  600 mg Oral BID AC   Infusions:  . sodium chloride 75 mL/hr at 04/29/19 2230  . sodium chloride Stopped (04/29/19 2229)  . heparin    . piperacillin-tazobactam (ZOSYN)  IV 3.375 g (04/29/19 2229)    Assessment: 63 yo M on Warfarin for Hx Afib, Aortic Valve replacement. To be transitioned to Heparin drip. Discussed to stop heparin 1-2 hours prior to procedure.  Goal INR per Cardiology notes= 2.0-3.0 On Warfarin 5 mg daily PTA per Med Rec Hgb 15.9  Plt 276  INR 2.5 >> 2.7  Goal of Therapy:  Heparin level 0.3-0.7 units/ml Monitor platelets by anticoagulation protocol: Yes   Plan:  07/18 @ 0500 INR 1.7. Will go ahead  and start heparin drip at 1150 units/hr and will not bother with checking an anti-Xa level since drip to be stopped 4 hours pre-surgery. Patient is scheduled to get a laproscopic cholecystectomy at 1400 per RN; therefore drip will need to be stopped at 1000. Stop time placed on order. Will need to f/u anticoagulation plans post-op.  Tobie Lords, PharmD, BCPS Clinical Pharmacist 04/30/2019,5:27 AM

## 2019-04-30 NOTE — Progress Notes (Signed)
UPDATE:  Notified by Dr. Peyton Najjar about miscommunication regarding INR goal.  I called patient directly to explain the situation, and still recommended IR drain placement since that is the safest option at this point.  His INR is too high to proceed with surgery, and abx and prolonged narcotics use likely will not significantly improve his pain.  Pt verbalized understanding, stating "whatever needs to be done".  He also mentioned he will like me to perform the eventual cholecystectomy.  I called wife, POA, introduced myself as Psychologist, sport and exercise that called the day before and discussed with her the situation.  We extensively discussed why IR drain placement is safest option at this point.  Waiting any longer for surgery will increase risk of perioperative complications such as injury to surrounding structures, due to the continued inflammatory changes.  I explained to her that drain placement will more than likely relieve the pain he is currently experiencing, allow decrease in inflammtory response, which in turn will result in decreased perioperative complications at the subsequent interval cholecystectomy.  I emphasized to her over and over that this is the safest option at this point.  Wife verbalized understanding, but stated that she believes her husband will not tolerate keeping a drain in place for few weeks.  She mentioned how his pain is still not under control, and she believes surgery is the best option at this point.  I explained to her that surgery is the DEFINITIVE treatment for acute cholecystitis, but the increased risks of proceeding with surgery during this hospitalization outweighs the alternative option of tube placement and interval chole.    She again reiterated that the drain option will not be best for her husband, stating they both understand all the risk of proceeding with surgery, including but limited to, post-op infxn, seroma, biloma, chronic pain, poor-delayed wound healing, retained  gallstone, conversion to open procedure, post-op SBO or ileus, and need for additional procedures to address said risks.  The risks of general anesthetic including MI, DVT, CVA, sudden death or even reaction to anesthetic medications also discussed.   We came to an agreement that we will proceed with lap chole in a couple days once INR is within normal limits and I am available for surgery.  Surgery in a couple days will be the only option to resolve this if patient is refusing drain placement.  Will adjust pain meds in the mean time for better pain control, avoid morphine due to history of increased agitation with use.  She did agree to drain placement only if pain cannot be adequately controlled with meds while waiting for surgery.

## 2019-04-30 NOTE — Plan of Care (Signed)
Patient doing ok.  This morning there was a big deal on whether the patient would have surgery or drain placed  or nothing right now.  Once all the doctors talked with the patient and wife and myself it was determined that the patient would not have surgery or the drain placed and would wait until Monday to have surgery then.  The patient was frustrated with all of the mix up and mixed messages he was receiving from the doctors.  Patient was still having pain but did not want to take the Morphine or Dilaudid because it made him feel bad.  He did agree to [employer], so I gave him that and the Valium and the patient actually relaxed and slept for a while.  He is tolerating sips well.  IV antibiotics are continued.  I have spoken to the patients wife multiple times today.  We right now are all on the same page.  No significant changes. Will continue to monitor.

## 2019-04-30 NOTE — Progress Notes (Signed)
ANTICOAGULATION CONSULT NOTE - Initial Consult  Pharmacy Consult for Heparin Drip Indication: Transition from Warfarin in anticipation of possible surgery  Allergies  Allergen Reactions  . Insulins Other (See Comments)    Patient Measurements: Height: 5\' 7"  (170.2 cm) Weight: 195 lb (88.5 kg) IBW/kg (Calculated) : 66.1 Heparin Dosing Weight:  84.5 kg   Vital Signs: Temp: 98.8 F (37.1 C) (07/18 2053) Temp Source: Oral (07/18 2053) BP: 146/82 (07/18 2053) Pulse Rate: 70 (07/18 2053)  Labs: Recent Labs    04/28/19 1139 04/28/19 1236 04/29/19 0716 04/30/19 0302 04/30/19 1221 04/30/19 2023  HGB 15.9  --  13.8 13.9  --   --   HCT 48.9  --  42.2 43.1  --   --   PLT 276  --  209 202  --   --   APTT  --  53*  --   --   --   --   LABPROT  --  26.9* 28.1* 20.0*  --   --   INR  --  2.5* 2.7* 1.7*  --   --   HEPARINUNFRC  --   --   --   --  0.14* 0.42  CREATININE 1.06  --  0.96 1.06  --   --     Estimated Creatinine Clearance: 75.8 mL/min (by C-G formula based on SCr of 1.06 mg/dL).   Medical History: Past Medical History:  Diagnosis Date  . Carotid artery occlusion   . CHF (congestive heart failure) ([place])   . Diabetes mellitus without complication ([place])   . Hyperlipidemia   . Hypertension   . Stroke [facility])     Medications:  Scheduled:  . atorvastatin  40 mg Oral q1800  . carvedilol  12.5 mg Oral BID WC  . dofetilide  500 mcg Oral BID  . finasteride  5 mg Oral Daily  . gemfibrozil  600 mg Oral BID AC   Infusions:  . sodium chloride 75 mL/hr at 04/30/19 2025  . sodium chloride Stopped (04/30/19 1238)  . heparin 1,350 Units/hr (04/30/19 1544)  . piperacillin-tazobactam (ZOSYN)  IV 3.375 g (04/30/19 2034)    Assessment: Pharmacy consulted for heparin drip management for 63 yo male admitted with acute cholecystitis and planned surgical intervention early next week. Patient takes warfarin due to atrial fibrillation as an outpatient. INR is 1.7 today and patient  was been initiated on heparin infusion at 1150 units/hr.  7/18 -  Drip initiated 1150 units/hr 7/18 1221 HL 0.14 - 2500 unit bolus, Rate inc to 1350 units/hr 7/18 2023 HL 0.42 - Continue 1350 units/hr   Goal of Therapy:  Heparin level 0.3-0.7 units/ml Monitor platelets by anticoagulation protocol: Yes   Plan:  HL is 0.42 - therapeutic x 1  Will recheck HL in 6 hours per protocol and monitor CBC daily per protocol  Pharmacy will continue to monitor and adjust per consult.   Lu Duffel, PharmD, BCPS Clinical Pharmacist 04/30/2019 9:14 PM

## 2019-04-30 NOTE — Progress Notes (Signed)
[facility] [place] NAME: Michael Patrick    MR#:  [id number]  DATE OF BIRTH:  [birth year]  SUBJECTIVE:   Patient admitted to the hospital due to diffuse abdominal pain and suspected to have acute cholecystitis.   Patient still complaining of generalized abdominal pain.  REVIEW OF SYSTEMS:    Review of Systems  Constitutional: Negative for chills and fever.  HENT: Negative for congestion and tinnitus.   Eyes: Negative for blurred vision and double vision.  Respiratory: Negative for cough, shortness of breath and wheezing.   Cardiovascular: Negative for chest pain, orthopnea and PND.  Gastrointestinal: Positive for abdominal pain. Negative for diarrhea, nausea and vomiting.  Genitourinary: Negative for dysuria and hematuria.  Neurological: Negative for dizziness, sensory change and focal weakness.  All other systems reviewed and are negative.   Nutrition: NPO Tolerating Diet: No Tolerating PT: Await Eval.   DRUG ALLERGIES:   Allergies  Allergen Reactions  . Insulins Other (See Comments)    VITALS:  Blood pressure 130/66, pulse 70, temperature 98.7 F (37.1 C), temperature source Oral, resp. rate 18, height 5\' 7"  (1.702 m), weight 88.5 kg, SpO2 98 %.  PHYSICAL EXAMINATION:   Physical Exam  GENERAL:  64 y.o.-year-old patient lying in bed in no acute distress.  EYES: Pupils equal, round, reactive to light and accommodation. No scleral icterus. Extraocular muscles intact.  HEENT: Head atraumatic, normocephalic. Oropharynx and nasopharynx clear.  NECK:  Supple, no jugular venous distention. No thyroid enlargement, no tenderness.  LUNGS: Normal breath sounds bilaterally, no wheezing, rales, rhonchi. No use of accessory muscles of respiration.  CARDIOVASCULAR: S1, S2 RRR , + metallic valve click . No murmurs, rubs, or gallops.  ABDOMEN: Soft, Tender diffusely, no rebound, rigidity, nondistended. Bowel sounds present. No  organomegaly or mass.  EXTREMITIES: No cyanosis, clubbing or edema b/l.    NEUROLOGIC: Cranial nerves II through XII are intact. No focal Motor or sensory deficits b/l.   PSYCHIATRIC: The patient is alert and oriented x 3.  SKIN: No obvious rash, lesion, or ulcer.    LABORATORY PANEL:   CBC Recent Labs  Lab 04/30/19 0302  WBC 20.6*  HGB 13.9  HCT 43.1  PLT 202   ------------------------------------------------------------------------------------------------------------------  Chemistries  Recent Labs  Lab 04/30/19 0302  NA 136  K 4.2  CL 101  CO2 25  GLUCOSE 93  BUN 32*  CREATININE 1.06  CALCIUM 8.8*  MG 2.4  AST 24  ALT 12  ALKPHOS 81  BILITOT 1.0   ------------------------------------------------------------------------------------------------------------------  Cardiac Enzymes No results for input(s): TROPONINI in the last 168 hours. ------------------------------------------------------------------------------------------------------------------  RADIOLOGY:  Dg Chest 1 View  Result Date: 04/28/2019 CLINICAL DATA:  Leukocytosis EXAM: CHEST  1 VIEW COMPARISON:  None. FINDINGS: Pacemaker leads are attached to the right atrium and right ventricle. Heart is borderline enlarged with pulmonary vascularity normal. There is aortic atherosclerosis. No lung edema or consolidation. No adenopathy. No bone lesions. There is calcification in each carotid artery with a stent in the left carotid artery, incompletely visualized. IMPRESSION: No edema or consolidation. Heart borderline enlarged with pacemaker leads attached to the right heart. Aortic Atherosclerosis (ICD10-I70.0). Foci of carotid artery calcification noted with incomplete visualization of a left carotid stent. Electronically Signed   By: Lowella Grip III M.D.   On: 04/28/2019 14:47   Nm Hepato W/eject Fract  Result Date: 04/29/2019 CLINICAL DATA:  Right upper quadrant pain. EXAM: NUCLEAR MEDICINE HEPATOBILIARY  IMAGING TECHNIQUE:  Sequential images of the abdomen were obtained out to 120 minutes following intravenous administration of radiopharmaceutical. 3.5 mg of IV morphine were administered after 90 minutes. RADIOPHARMACEUTICALS:  8.05 mCi Tc-28m  Choletec IV COMPARISON:  Right upper quadrant ultrasound and CT abdomen pelvis from yesterday. FINDINGS: Prompt uptake and biliary excretion of activity by the liver is seen. Gallbladder activity is not visualized, consistent with obstructed cystic duct. Biliary activity passes into small bowel, consistent with patent common bile duct. IMPRESSION: 1. Findings consistent with acute cholecystitis. Electronically Signed   By: Titus Dubin M.D.   On: 04/29/2019 15:34   US Abdomen Limited Ruq  Result Date: 04/28/2019 CLINICAL DATA:  Diffuse abdominal pain with nausea since yesterday. EXAM: ULTRASOUND ABDOMEN LIMITED RIGHT UPPER QUADRANT COMPARISON:  CT 04/28/2019 FINDINGS: Gallbladder: Evidence of mild cholelithiasis as well as sludge. Minimal gallbladder wall thickening measuring 3-4 mm. Negative sonographic Murphy sign. No adjacent free fluid. Common bile duct: Diameter: 5.9 mm. Liver: Mild increased parenchymal echogenicity without focal mass. Portal vein is patent on color Doppler imaging with normal direction of blood flow towards the liver. IMPRESSION: Mild cholelithiasis and sludge with slight gallbladder wall thickening. Findings may be seen with acute cholecystitis as recommend clinical correlation. Mild hepatic steatosis without focal mass. Electronically Signed   By: Marin Olp M.D.   On: 04/28/2019 15:14     ASSESSMENT AND PLAN:   63 year old male with past medical history of hypertension, hyperlipidemia, diabetes, CHF, history of previous CVA, carotid artery occlusion, hx of st. Jude's aortic valve who presented to the hospital due to abdominal pain and suspected to have acute cholecystitis.  1.  Acute cholecystitis-this is suspected diagnosis given  patient's abdominal pain and ultrasound findings. - Seen by general surgery and confirmed it by doing a HIDA scan. - Continue empiric antibiotics with Zosyn. - Possible  laparoscopic cholecystectomy once INR is down to safely do the surgery -option for IR to place drain was given to wife and pt--they opt for surgery (planned early next week)  2.  History of atrial fibrillation- rate controlled. -Continue carvedilol, continue Tikosyn.  Coumadin on hold as patient likely to have surgery tomorrow.  - Patient now on IV heparin drip pending surgical plan  3.  Acquired coagulopathy-secondary to the Coumadin. - INR today is 1.7. received po 5mg  vitamin K.   -Pharmacy managing heparin drip  4.  History of St. Jude's aortic valve- patient is on Coumadin but coagulopathy needs to be reversed for surgery. - now on IV heparin gtt  5.  Hyperlipidemia-continue atorvastatin.  6.  BPH- continue finasteride.   All the records are reviewed and case discussed with Care Management/Social Worker. Management plans discussed with the patient  Wife has been discussing plans with Dr Lysle Pearl  CODE STATUS: Full code  DVT Prophylaxis: Coumadin/heparin gtt  TOTAL TIME TAKING CARE OF THIS PATIENT: 30 minutes.   POSSIBLE D/C IN few DAYS, DEPENDING ON CLINICAL CONDITION.   Fritzi Mandes M.D on 04/30/2019 at 1:39 PM  Between 7am to 6pm - Pager - [phone] After 6pm go to [email or web address] - Proofreader  [facility]  Office  [phone]  CC: Primary care physician; Rusty Aus, MD

## 2019-05-01 DIAGNOSIS — K810 Acute cholecystitis: Principal | ICD-10-CM

## 2019-05-01 LAB — PHOSPHORUS: Phosphorus: 2.4 mg/dL — ABNORMAL LOW (ref 2.5–4.6)

## 2019-05-01 LAB — HEPATIC FUNCTION PANEL
ALT: 13 U/L (ref 0–44)
AST: 23 U/L (ref 15–41)
Albumin: 3.1 g/dL — ABNORMAL LOW (ref 3.5–5.0)
Alkaline Phosphatase: 81 U/L (ref 38–126)
Bilirubin, Direct: 0.3 mg/dL — ABNORMAL HIGH (ref 0.0–0.2)
Indirect Bilirubin: 0.6 mg/dL (ref 0.3–0.9)
Total Bilirubin: 0.9 mg/dL (ref 0.3–1.2)
Total Protein: 6.7 g/dL (ref 6.5–8.1)

## 2019-05-01 LAB — CBC
HCT: 36.7 % — ABNORMAL LOW (ref 39.0–52.0)
Hemoglobin: 11.9 g/dL — ABNORMAL LOW (ref 13.0–17.0)
MCH: 27.9 pg (ref 26.0–34.0)
MCHC: 32.4 g/dL (ref 30.0–36.0)
MCV: 85.9 fL (ref 80.0–100.0)
Platelets: 213 10*3/uL (ref 150–400)
RBC: 4.27 MIL/uL (ref 4.22–5.81)
RDW: 14.6 % (ref 11.5–15.5)
WBC: 19.5 10*3/uL — ABNORMAL HIGH (ref 4.0–10.5)
nRBC: 0 % (ref 0.0–0.2)

## 2019-05-01 LAB — BASIC METABOLIC PANEL
Anion gap: 10 (ref 5–15)
BUN: 27 mg/dL — ABNORMAL HIGH (ref 8–23)
CO2: 21 mmol/L — ABNORMAL LOW (ref 22–32)
Calcium: 8.6 mg/dL — ABNORMAL LOW (ref 8.9–10.3)
Chloride: 107 mmol/L (ref 98–111)
Creatinine, Ser: 0.85 mg/dL (ref 0.61–1.24)
GFR calc Af Amer: >60 mL/min (ref >60)
GFR calc non Af Amer: >60 mL/min (ref >60)
Glucose, Bld: 80 mg/dL (ref 70–99)
Potassium: 3.7 mmol/L (ref 3.5–5.1)
Sodium: 138 mmol/L (ref 135–145)

## 2019-05-01 LAB — HEPARIN LEVEL (UNFRACTIONATED): Heparin Unfractionated: 0.39 IU/mL (ref 0.30–0.70)

## 2019-05-01 LAB — MAGNESIUM: Magnesium: 2.4 mg/dL (ref 1.7–2.4)

## 2019-05-01 MED ORDER — OXYCODONE HCL 5 MG PO TABS: 5.0000 mg | ORAL_TABLET | ORAL | Status: DC | PRN

## 2019-05-01 MED ORDER — POTASSIUM CHLORIDE IN NACL 20-0.9 MEQ/L-% IV SOLN
INTRAVENOUS | Status: DC
  Administered 2019-05-01 – 2019-05-02 (×3): via INTRAVENOUS
  Filled 2019-05-01 (×4): qty 1000

## 2019-05-01 MED ORDER — OXYCODONE-ACETAMINOPHEN 5-325 MG PO TABS
1.0000 | ORAL_TABLET | ORAL | Status: DC | PRN
  Administered 2019-05-01 – 2019-05-04 (×12): 2 via ORAL
  Filled 2019-05-01 (×13): qty 2

## 2019-05-01 NOTE — Progress Notes (Signed)
Patient managed with oxycodone. Patient was short and agitated with staff this am but has been calmer since the day progressed and after receiving valium

## 2019-05-01 NOTE — Progress Notes (Addendum)
Pharmacy Electrolyte Monitoring Consult:  Pharmacy consulted to assist in monitoring and replacing electrolytes in this 63 y.o. male admitted on 04/28/2019 with Abdominal Pain   Labs:  Sodium (mmol/L)  Date Value  05/01/2019 138   Potassium (mmol/L)  Date Value  05/01/2019 3.7   Magnesium (mg/dL)  Date Value  05/01/2019 2.4   Phosphorus (mg/dL)  Date Value  05/01/2019 2.4 (L)   Calcium (mg/dL)  Date Value  05/01/2019 8.6 (L)   Albumin (g/dL)  Date Value  05/01/2019 3.1 (L)    Assessment/Plan: Patient is currently ordered dofetilide 552mcg BID. Pharmacy consulted for electrolyte replacement to maintain potassium at or above 4.   Potassium added to MIVF now NS/Potassium 12mEq at 60mL/hr.   Will check BMP with am labs.   Pharmacy will continue to monitor and adjust per consult.    Simpson,Michael L 05/01/2019 3:01 PM

## 2019-05-01 NOTE — Progress Notes (Signed)
[facility] [facility] [place] NAME: Michael Patrick    MR#:  [id number]  DATE OF BIRTH:  05-25-1956  SUBJECTIVE:   Patient admitted to the hospital due to diffuse abdominal pain and suspected to have acute cholecystitis.   Patient still complaining of generalized abdominal pain.  REVIEW OF SYSTEMS:    Review of Systems  Constitutional: Negative for chills and fever.  HENT: Negative for congestion and tinnitus.   Eyes: Negative for blurred vision and double vision.  Respiratory: Negative for cough, shortness of breath and wheezing.   Cardiovascular: Negative for chest pain, orthopnea and PND.  Gastrointestinal: Positive for abdominal pain. Negative for diarrhea, nausea and vomiting.  Genitourinary: Negative for dysuria and hematuria.  Neurological: Negative for dizziness, sensory change and focal weakness.  All other systems reviewed and are negative.  Nutrition: NPO Tolerating Diet: No  DRUG ALLERGIES:   Allergies  Allergen Reactions  . Insulins Other (See Comments)    VITALS:  Blood pressure (!) 147/72, pulse 70, temperature 98.6 F (37 C), temperature source Oral, resp. rate 19, height 5\' 7"  (1.702 m), weight 88.5 kg, SpO2 98 %.  PHYSICAL EXAMINATION:   Physical Exam  GENERAL:  63 y.o.-year-old patient lying in bed in no acute distress.  EYES: Pupils equal, round, reactive to light and accommodation. No scleral icterus. Extraocular muscles intact.  HEENT: Head atraumatic, normocephalic. Oropharynx and nasopharynx clear.  NECK:  Supple, no jugular venous distention. No thyroid enlargement, no tenderness.  LUNGS: Normal breath sounds bilaterally, no wheezing, rales, rhonchi. No use of accessory muscles of respiration.  CARDIOVASCULAR: S1, S2 RRR , + metallic valve click . No murmurs, rubs, or gallops.  ABDOMEN: Soft, Tender diffusely, no rebound, rigidity, nondistended. Bowel sounds present. No organomegaly or mass.  EXTREMITIES: No  cyanosis, clubbing or edema b/l.    NEUROLOGIC: Cranial nerves II through XII are intact. No focal Motor or sensory deficits b/l.   PSYCHIATRIC: patient is alert and oriented x 3.  SKIN: No obvious rash, lesion, or ulcer.    LABORATORY PANEL:   CBC Recent Labs  Lab 05/01/19 0238  WBC 19.5*  HGB 11.9*  HCT 36.7*  PLT 213   ------------------------------------------------------------------------------------------------------------------  Chemistries  Recent Labs  Lab 05/01/19 0238  NA 138  K 3.7  CL 107  CO2 21*  GLUCOSE 80  BUN 27*  CREATININE 0.85  CALCIUM 8.6*  MG 2.4  AST 23  ALT 13  ALKPHOS 81  BILITOT 0.9   ------------------------------------------------------------------------------------------------------------------  Cardiac Enzymes No results for input(s): TROPONINI in the last 168 hours. ------------------------------------------------------------------------------------------------------------------  RADIOLOGY:  Nm Hepato W/eject Fract  Result Date: 04/29/2019 CLINICAL DATA:  Right upper quadrant pain. EXAM: NUCLEAR MEDICINE HEPATOBILIARY IMAGING TECHNIQUE: Sequential images of the abdomen were obtained out to 120 minutes following intravenous administration of radiopharmaceutical. 3.5 mg of IV morphine were administered after 90 minutes. RADIOPHARMACEUTICALS:  8.05 mCi Tc-23m  Choletec IV COMPARISON:  Right upper quadrant ultrasound and CT abdomen pelvis from yesterday. FINDINGS: Prompt uptake and biliary excretion of activity by the liver is seen. Gallbladder activity is not visualized, consistent with obstructed cystic duct. Biliary activity passes into small bowel, consistent with patent common bile duct. IMPRESSION: 1. Findings consistent with acute cholecystitis. Electronically Signed   By: Titus Dubin M.D.   On: 04/29/2019 15:34   ASSESSMENT AND PLAN:   63 year old male with past medical history of hypertension, hyperlipidemia, diabetes, CHF,  history of previous CVA, carotid artery occlusion,  hx of st. Jude's aortic valve who presented to the hospital due to abdominal pain and suspected to have acute cholecystitis.  1. Acute cholecystitis-this is the diagnosis given patient's abdominal pain and ultrasound findings. - Seen by general surgery and confirmed it by doing a HIDA scan. - Continue empiric antibiotics with Zosyn. - Plans for laparoscopic cholecystectomy once INR is down to safely do the surgery--on Monday per Dr Lysle Pearl -option for IR to place drain was given to wife and pt--they opt for surgery (planned early next week)  2. History of atrial fibrillation- rate controlled. -Continue carvedilol, continue Tikosyn.   -Coumadin on hold  Due to plans for surgery tomorrow.  Cont IV heparin  3.  Acquired coagulopathy-secondary to the Coumadin. - INR today is 1.7. received po 5mg  vitamin K 2 days ago.   -Pharmacy managing heparin drip  4.  History of St. Jude's aortic valve- patient is on Coumadin but coagulopathy needs to be reversed for surgery. - now on IV heparin gtt  5.  Hyperlipidemia-continue atorvastatin.  6.  BPH- continue finasteride.   All the records are reviewed and case discussed with Care Management/Social Worker. Management plans discussed with the patient  Wife has been discussing plans with Dr Lysle Pearl  CODE STATUS: Full code  DVT Prophylaxis: Coumadin/heparin gtt  TOTAL TIME TAKING CARE OF THIS PATIENT: 30 minutes.   POSSIBLE D/C IN few DAYS, DEPENDING ON CLINICAL CONDITION.   Fritzi Mandes M.D on 05/01/2019 at 10:04 AM  Between 7am to 6pm - Pager - [phone] After 6pm go to [email or web address] - Proofreader  [facility]  Office  [phone]  CC: Primary care physician; Rusty Aus, MD

## 2019-05-01 NOTE — Progress Notes (Signed)
Patient is requesting liquids to drink. Order received from Dr Dahlia Byes for clear liquids now then NPO after MN

## 2019-05-01 NOTE — Progress Notes (Signed)
CC: Cholecystitis  Subjective: Patient continues to endorse right upper quadrant pain.  Of note he does have a history of chronic pain and has been on Percocet for more than 2 decades.  Yesterday 1.7 and still elevated.  Dr. Lysle Pearl planning on doing cholecystectomy tomorrow  Objective: Vital signs in last 24 hours: Temp:  [98.6 F (37 C)-98.8 F (37.1 C)] 98.6 F (37 C) (07/19 0357) Pulse Rate:  [69-70] 70 (07/19 0850) Resp:  [18-19] 19 (07/19 0357) BP: (130-158)/(66-82) 147/72 (07/19 0850) SpO2:  [98 %-99 %] 98 % (07/19 0357) Last BM Date: 05/01/19  Intake/Output from previous day: 07/18 0701 - 07/19 0700 In: 1842 [I.V.:1692.2; IV Piggyback:149.9] Out: 425 [Urine:425] Intake/Output this shift: No intake/output data recorded.  Physical exam:  NAD Abd: soft, mild TTP RUQ, no peritonitis, no Murphy Ext; No edema and well perfused  Lab Results: CBC  Recent Labs    04/30/19 0302 05/01/19 0238  WBC 20.6* 19.5*  HGB 13.9 11.9*  HCT 43.1 36.7*  PLT 202 213   BMET Recent Labs    04/30/19 0302 05/01/19 0238  NA 136 138  K 4.2 3.7  CL 101 107  CO2 25 21*  GLUCOSE 93 80  BUN 32* 27*  CREATININE 1.06 0.85  CALCIUM 8.8* 8.6*   PT/INR Recent Labs    04/29/19 0716 04/30/19 0302  LABPROT 28.1* 20.0*  INR 2.7* 1.7*   ABG No results for input(s): PHART, HCO3 in the last 72 hours.  Invalid input(s): PCO2, PO2  Studies/Results: Nm Hepato W/eject Fract  Result Date: 04/29/2019 CLINICAL DATA:  Right upper quadrant pain. EXAM: NUCLEAR MEDICINE HEPATOBILIARY IMAGING TECHNIQUE: Sequential images of the abdomen were obtained out to 120 minutes following intravenous administration of radiopharmaceutical. 3.5 mg of IV morphine were administered after 90 minutes. RADIOPHARMACEUTICALS:  8.05 mCi Tc-14m  Choletec IV COMPARISON:  Right upper quadrant ultrasound and CT abdomen pelvis from yesterday. FINDINGS: Prompt uptake and biliary excretion of activity by the liver is seen.  Gallbladder activity is not visualized, consistent with obstructed cystic duct. Biliary activity passes into small bowel, consistent with patent common bile duct. IMPRESSION: 1. Findings consistent with acute cholecystitis. Electronically Signed   By: Titus Dubin M.D.   On: 04/29/2019 15:34    Anti-infectives: Anti-infectives (From admission, onward)   Start     Dose/Rate Route Frequency Ordered Stop   04/28/19 2200  piperacillin-tazobactam (ZOSYN) IVPB 3.375 g     3.375 g 12.5 mL/hr over 240 Minutes Intravenous Every 8 hours 04/28/19 1708     04/28/19 1545  vancomycin (VANCOCIN) 1,500 mg in sodium chloride 0.9 % 500 mL IVPB     1,500 mg 250 mL/hr over 120 Minutes Intravenous  Once 04/28/19 1534 04/28/19 1824   04/28/19 1230  piperacillin-tazobactam (ZOSYN) IVPB 3.375 g     3.375 g 100 mL/hr over 30 Minutes Intravenous  Once 04/28/19 1216 04/28/19 1401      Assessment/Plan: Acute cholecystitis in a patient with multiple comorbidities and anticoagulated.  For cholecystectomy by Dr. Lysle Pearl tomorrow.  Discussed with the patient in detail about the plan.  He understands.  I we will add oxycodone on a more frequent basis given the continuation of pain.  There is no evidence of peritonitis or a condition that will require emergent surgery today. Continue antibiotic therapy for now   Caroleen Hamman, MD, [facility]  05/01/2019

## 2019-05-01 NOTE — Progress Notes (Signed)
ANTICOAGULATION CONSULT NOTE - Initial Consult  Pharmacy Consult for Heparin Drip Indication: Transition from Warfarin in anticipation of possible surgery  Allergies  Allergen Reactions  . Insulins Other (See Comments)    Patient Measurements: Height: 5\' 7"  (170.2 cm) Weight: 195 lb (88.5 kg) IBW/kg (Calculated) : 66.1 Heparin Dosing Weight:  84.5 kg   Vital Signs: Temp: 98.6 F (37 C) (07/19 0357) Temp Source: Oral (07/19 0357) BP: 158/81 (07/19 0357) Pulse Rate: 69 (07/19 0357)  Labs: Recent Labs    04/28/19 1236 04/29/19 0716 04/30/19 0302 04/30/19 1221 04/30/19 2023 05/01/19 0238  HGB  --  13.8 13.9  --   --  11.9*  HCT  --  42.2 43.1  --   --  36.7*  PLT  --  209 202  --   --  213  APTT 53*  --   --   --   --   --   LABPROT 26.9* 28.1* 20.0*  --   --   --   INR 2.5* 2.7* 1.7*  --   --   --   HEPARINUNFRC  --   --   --  0.14* 0.42 0.39  CREATININE  --  0.96 1.06  --   --  0.85    Estimated Creatinine Clearance: 94.5 mL/min (by C-G formula based on SCr of 0.85 mg/dL).   Medical History: Past Medical History:  Diagnosis Date  . Carotid artery occlusion   . CHF (congestive heart failure) ([place])   . Diabetes mellitus without complication ([place])   . Hyperlipidemia   . Hypertension   . Stroke [facility])     Medications:  Scheduled:  . atorvastatin  40 mg Oral q1800  . carvedilol  12.5 mg Oral BID WC  . dofetilide  500 mcg Oral BID  . finasteride  5 mg Oral Daily  . gemfibrozil  600 mg Oral BID AC   Infusions:  . sodium chloride 75 mL/hr at 05/01/19 0200  . sodium chloride Stopped (04/30/19 1830)  . heparin 1,350 Units/hr (05/01/19 0200)  . piperacillin-tazobactam (ZOSYN)  IV Stopped (05/01/19 0041)    Assessment: Pharmacy consulted for heparin drip management for 63 yo male admitted with acute cholecystitis and planned surgical intervention early next week. Patient takes warfarin due to atrial fibrillation as an outpatient. INR is 1.7 today and patient  was been initiated on heparin infusion at 1150 units/hr.  7/18 -  Drip initiated 1150 units/hr 7/18 1221 HL 0.14 - 2500 unit bolus, Rate inc to 1350 units/hr 7/18 2023 HL 0.42 - Continue 1350 units/hr   Goal of Therapy:  Heparin level 0.3-0.7 units/ml Monitor platelets by anticoagulation protocol: Yes   Plan:  07/19 @ 0230 HL 0.39 therapeutic. Will continue current rate and will recheck HL w/ am labs. Patient has not gone to surgery d/t INR still being too elevated per surgery. Surgeon spoke to wife about drain placement in the interim (no drain placed yet). H/h dropped a little, will continue to monitor.  Tobie Lords, PharmD, BCPS Clinical Pharmacist 05/01/2019 4:08 AM

## 2019-05-02 ENCOUNTER — Encounter: Payer: Self-pay | Admitting: Anesthesiology

## 2019-05-02 ENCOUNTER — Encounter
Admission: EM | Disposition: A | Discharge status: Home / Self Care | Payer: Self-pay | Source: Home / Self Care | Attending: Internal Medicine

## 2019-05-02 LAB — CBC
HCT: 36.6 % — ABNORMAL LOW (ref 39.0–52.0)
Hemoglobin: 11.9 g/dL — ABNORMAL LOW (ref 13.0–17.0)
MCH: 28.1 pg (ref 26.0–34.0)
MCHC: 32.5 g/dL (ref 30.0–36.0)
MCV: 86.3 fL (ref 80.0–100.0)
Platelets: 235 10*3/uL (ref 150–400)
RBC: 4.24 MIL/uL (ref 4.22–5.81)
RDW: 14.6 % (ref 11.5–15.5)
WBC: 10 10*3/uL (ref 4.0–10.5)
nRBC: 0 % (ref 0.0–0.2)

## 2019-05-02 LAB — PHOSPHORUS: Phosphorus: 2.5 mg/dL (ref 2.5–4.6)

## 2019-05-02 LAB — BASIC METABOLIC PANEL
Anion gap: 7 (ref 5–15)
BUN: 18 mg/dL (ref 8–23)
CO2: 24 mmol/L (ref 22–32)
Calcium: 8.7 mg/dL — ABNORMAL LOW (ref 8.9–10.3)
Chloride: 107 mmol/L (ref 98–111)
Creatinine, Ser: 0.78 mg/dL (ref 0.61–1.24)
GFR calc Af Amer: >60 mL/min (ref >60)
GFR calc non Af Amer: >60 mL/min (ref >60)
Glucose, Bld: 67 mg/dL — ABNORMAL LOW (ref 70–99)
Potassium: 3.9 mmol/L (ref 3.5–5.1)
Sodium: 138 mmol/L (ref 135–145)

## 2019-05-02 LAB — PROTIME-INR
INR: 1.5 — ABNORMAL HIGH (ref 0.8–1.2)
Prothrombin Time: 17.5 seconds — ABNORMAL HIGH (ref 11.4–15.2)

## 2019-05-02 LAB — HEPARIN LEVEL (UNFRACTIONATED): Heparin Unfractionated: 0.32 IU/mL (ref 0.30–0.70)

## 2019-05-02 LAB — HEPATIC FUNCTION PANEL
ALT: 97 U/L — ABNORMAL HIGH (ref 0–44)
AST: 122 U/L — ABNORMAL HIGH (ref 15–41)
Albumin: 3 g/dL — ABNORMAL LOW (ref 3.5–5.0)
Alkaline Phosphatase: 303 U/L — ABNORMAL HIGH (ref 38–126)
Bilirubin, Direct: 0.2 mg/dL (ref 0.0–0.2)
Indirect Bilirubin: 0.7 mg/dL (ref 0.3–0.9)
Total Bilirubin: 0.9 mg/dL (ref 0.3–1.2)
Total Protein: 6.6 g/dL (ref 6.5–8.1)

## 2019-05-02 LAB — MAGNESIUM: Magnesium: 2.2 mg/dL (ref 1.7–2.4)

## 2019-05-02 LAB — GLUCOSE, CAPILLARY
Glucose-Capillary: 95 mg/dL (ref 70–99)
Glucose-Capillary: 61 mg/dL — ABNORMAL LOW (ref 70–99)

## 2019-05-02 SURGERY — LAPAROSCOPIC CHOLECYSTECTOMY
Anesthesia: General

## 2019-05-02 MED ORDER — MIDAZOLAM HCL 5 MG/5ML IJ SOLN
INTRAMUSCULAR | Status: AC
  Filled 2019-05-02: qty 5

## 2019-05-02 MED ORDER — DEXTROSE 50 % IV SOLN
25.0000 mL | Freq: Once | INTRAVENOUS | Status: AC
  Administered 2019-05-02: 07:00:00 25 mL via INTRAVENOUS

## 2019-05-02 MED ORDER — FENTANYL CITRATE (PF) 100 MCG/2ML IJ SOLN
INTRAMUSCULAR | Status: AC
  Filled 2019-05-02: qty 4

## 2019-05-02 MED ORDER — DEXTROSE 50 % IV SOLN
INTRAVENOUS | Status: AC
  Administered 2019-05-02: 25 mL via INTRAVENOUS
  Filled 2019-05-02: qty 50

## 2019-05-02 MED ORDER — POTASSIUM CHLORIDE CRYS ER 20 MEQ PO TBCR: 20.0000 meq | EXTENDED_RELEASE_TABLET | Freq: Once | ORAL | Status: DC

## 2019-05-02 SURGICAL SUPPLY — 59 items
ADH SKN CLS APL DERMABOND .7 (GAUZE/BANDAGES/DRESSINGS) ×1
ANCHOR TIS RET SYS 235ML (MISCELLANEOUS) ×4 IMPLANT
APL PRP STRL LF DISP 70% ISPRP (MISCELLANEOUS) ×1
APPLIER CLIP 5 13 M/L LIGAMAX5 (MISCELLANEOUS) ×3
APR CLP MED LRG 5 ANG JAW (MISCELLANEOUS) ×1
BAG TISS RTRVL C235 10X14 (MISCELLANEOUS) ×1
BLADE SURG SZ11 CARB STEEL (BLADE) ×4 IMPLANT
CANISTER SUCT 1200ML W/VALVE (MISCELLANEOUS) ×4 IMPLANT
CHLORAPREP W/TINT 26 (MISCELLANEOUS) ×4 IMPLANT
CHOLANGIOGRAM CATH TAUT (CATHETERS) IMPLANT
CLIP APPLIE 5 13 M/L LIGAMAX5 (MISCELLANEOUS) ×2 IMPLANT
COVER WAND RF STERILE (DRAPES) ×4 IMPLANT
DECANTER SPIKE VIAL GLASS SM (MISCELLANEOUS) ×8 IMPLANT
DEFOGGER SCOPE WARMER CLEARIFY (MISCELLANEOUS) ×4 IMPLANT
DERMABOND ADVANCED (GAUZE/BANDAGES/DRESSINGS) ×2
DERMABOND ADVANCED .7 DNX12 (GAUZE/BANDAGES/DRESSINGS) ×2 IMPLANT
DISSECTOR BLUNT TIP ENDO 5MM (MISCELLANEOUS) IMPLANT
DISSECTOR KITTNER STICK (MISCELLANEOUS) IMPLANT
DISSECTORS/KITTNER STICK (MISCELLANEOUS)
DRAPE 3/4 80X56 (DRAPES) IMPLANT
DRAPE C-ARM XRAY 36X54 (DRAPES) IMPLANT
ELECT CAUTERY BLADE 6.4 (BLADE) ×4 IMPLANT
ELECT REM PT RETURN 9FT ADLT (ELECTROSURGICAL) ×3
ELECTRODE REM PT RTRN 9FT ADLT (ELECTROSURGICAL) ×2 IMPLANT
GLOVE BIOGEL PI IND STRL 7.0 (GLOVE) ×2 IMPLANT
GLOVE BIOGEL PI INDICATOR 7.0 (GLOVE) ×2
GLOVE SURG SYN 6.5 ES PF (GLOVE) ×3 IMPLANT
GLOVE SURG SYN 6.5 PF PI (GLOVE) ×1 IMPLANT
GOWN STRL REUS W/ TWL LRG LVL3 (GOWN DISPOSABLE) ×6 IMPLANT
GOWN STRL REUS W/TWL LRG LVL3 (GOWN DISPOSABLE) ×9
GRASPER SUT TROCAR 14GX15 (MISCELLANEOUS) ×4 IMPLANT
IRRIGATION STRYKERFLOW (MISCELLANEOUS) IMPLANT
IRRIGATOR STRYKERFLOW (MISCELLANEOUS)
IV CATH ANGIO 12GX3 LT BLUE (NEEDLE) IMPLANT
IV NS 1000ML (IV SOLUTION)
IV NS 1000ML BAXH (IV SOLUTION) IMPLANT
JACKSON PRATT 10 (INSTRUMENTS) IMPLANT
L-HOOK LAP DISP 36CM (ELECTROSURGICAL) ×3
LABEL OR SOLS (LABEL) ×4 IMPLANT
LHOOK LAP DISP 36CM (ELECTROSURGICAL) ×2 IMPLANT
NEEDLE HYPO 22GX1.5 SAFETY (NEEDLE) ×4 IMPLANT
PACK LAP CHOLECYSTECTOMY (MISCELLANEOUS) ×4 IMPLANT
PENCIL ELECTRO HAND CTR (MISCELLANEOUS) ×4 IMPLANT
PORT ACCESS TROCAR AIRSEAL 5 (TROCAR) ×4 IMPLANT
SCISSORS METZENBAUM CVD 33 (INSTRUMENTS) ×4 IMPLANT
SET TRI-LUMEN FLTR TB AIRSEAL (TUBING) ×4 IMPLANT
SLEEVE ENDOPATH XCEL 5M (ENDOMECHANICALS) ×4 IMPLANT
SPONGE LAP 18X18 RF (DISPOSABLE) IMPLANT
STOPCOCK 4 WAY LG BORE MALE ST (IV SETS) IMPLANT
SUT MNCRL 4-0 (SUTURE) ×3
SUT MNCRL 4-0 27XMFL (SUTURE) ×1
SUT VIC AB 3-0 SH 27 (SUTURE)
SUT VIC AB 3-0 SH 27X BRD (SUTURE) IMPLANT
SUT VICRYL 0 AB UR-6 (SUTURE) ×8 IMPLANT
SUTURE MNCRL 4-0 27XMF (SUTURE) ×2 IMPLANT
SYR 20CC LL (SYRINGE) ×4 IMPLANT
TROCAR XCEL BLUNT TIP 100MML (ENDOMECHANICALS) ×4 IMPLANT
TROCAR XCEL NON-BLD 5MMX100MML (ENDOMECHANICALS) ×4 IMPLANT
WATER STERILE IRR 1000ML POUR (IV SOLUTION) ×4 IMPLANT

## 2019-05-02 NOTE — Progress Notes (Signed)
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  He has been quite debilitated due to chronic pain, fatigue.  He has on multiple occasions requested that his pacemaker be removed.  His AICD features of his device have been defeated.  He has been AV pacing at approximately 100% of the time.  He is now admitted to [facility] with acute cholecystitis.  He has deferred surgical intervention at a tertiary center and deferred tube placement to relieve the symptoms of his acute cholecystitis.  He is again asking that I turn off his pacemaker.  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  Patient and his wife understand that if the pacemaker is turned off he may not have an underlying rhythm and this active removing this support may end his life.  We have had multiple discussions regarding this in the past.  He is a DNR.  He appears to understand quite well the ramifications of this maneuver.  I have discussed this at length with a primary care physician caring for him in the hospital.  He defers palliative care.  We will interrogate his pacemaker and turned the rate down to 30 to see if he has any underlying rhythm.  At that point just further discussion regarding whether or not to defeat the pacemaker will be discussed.  Patient again fully understands the ramifications of this maneuver.  Patient's wife is present and also understands.

## 2019-05-02 NOTE — Progress Notes (Signed)
Patient has been very irritated today with staff and plan of care. He feels we are not "listening and understanding" him. He wants his pacemaker turned off and wants to die. This was communicated with Dr Posey Pronto earlier today. She has been communicating with palliative and Dr Ubaldo Glassing about this. Wife is in the room with the patient and has been able to talk with Dr Lysle Pearl, and Dr Posey Pronto in person. We are currently waiting on Palliative to see the patient

## 2019-05-02 NOTE — Progress Notes (Signed)
While checking on the patient he tells me to let the doctor know to go ahead and plan on putting the drain in if it can be done in the morning and to go ahead and give him his home meds. I explained that the home meds had been discontinued and he said he would be alright not taking them tonight. I asked him several questions and he became irritated with me but I explained that this was a different course of action than had been discussed. I told him I would send a message to Dr Lysle Pearl and Dr Posey Pronto (which I have done-they will come and talk to him tomorrow). He told me to leave him alone so he could watch his tv show. The wife came out and talked to me a few minutes later and clarified that he wanted the drain placed or the surgery to remove his gallbladder if it could be done in the am

## 2019-05-02 NOTE — Progress Notes (Signed)
When lab checked pt's sugar this am @ 0505 it was 67. I checked a fingerstick glucose and it was 61. Patient is NPO. Dextrose 25 ml was given. 15 minute blood sugar reck was 94.

## 2019-05-02 NOTE — Progress Notes (Signed)
Patient ID: Michael Patrick, male   DOB: 1955/11/14, 63 y.o.   MRN: [id number] spoke with Dr. Ubaldo Glassing again. Dr. Ubaldo Glassing made a visit to see patient and wife in the room. He went over the risk and complications of turning off the pacemaker. Dr. Ubaldo Glassing is working on to turn of the pacemaker.  I will hold off palliative care consult since patient and wife does not wanted. Patient wants to expedite turning of his pacemaker.

## 2019-05-02 NOTE — Progress Notes (Addendum)
Patient ID: Michael Patrick, male   DOB: 05/12/1956, 63 y.o.   MRN: [id number] spoke with patient and patient's wife in the room. The ER agreement for discontinuing all DIV trips including antibiotics and home medication. I will put patient on PRN morphine and oral Percocet. Patient is adamant to get his pacemaker turned off. I explained to him that I've spoken with Dr. Ubaldo Glassing and we feel palliative care needs to see patient discussed pros and cons and will work it out. Patient is a bit irritated and reporting that we are not listening to what he wants to do. I told him I have to follow the right path and we will get to it sooner or later. Wife was in the room and appreciated answering my questions. Michael Cagey RN was in the room with me during this converstaion

## 2019-05-02 NOTE — Progress Notes (Signed)
Pharmacy Electrolyte Monitoring Consult:  Pharmacy consulted to assist in monitoring and replacing electrolytes in this 63 y.o. male admitted on 04/28/2019 with Abdominal Pain   Labs:  Sodium (mmol/L)  Date Value  05/02/2019 138   Potassium (mmol/L)  Date Value  05/02/2019 3.9   Magnesium (mg/dL)  Date Value  05/02/2019 2.2   Phosphorus (mg/dL)  Date Value  05/02/2019 2.5   Calcium (mg/dL)  Date Value  05/02/2019 8.7 (L)   Albumin (g/dL)  Date Value  05/02/2019 3.0 (L)    Assessment/Plan: Patient is currently ordered dofetilide 565mcg BID. Pharmacy consulted for electrolyte replacement to maintain potassium at or above 4.   7/20 K 3.9,  Mag 2.2  Patient currently on IVF NS/Potassium 42mEq at 1mL/hr.  Will order KCL PO 20 meq x 1.  Will check BMP with am labs.   Pharmacy will continue to monitor and adjust per consult.    Aavya Shafer A 05/02/2019 10:37 AM

## 2019-05-02 NOTE — Care Management Important Message (Signed)
Important Message  Patient Details  Name: Michael Patrick MRN: [id number] Date of Birth: 07-13-1956   Medicare Important Message Given:  Yes     [name] 05/02/2019, 11:30 AM

## 2019-05-02 NOTE — Progress Notes (Signed)
Spoke with patient and patient's wife at bedside, confirming their wishes of transitioning to palliative care.  They did not have any further questions regarding specific treatments regarding his acute cholecystitis at this time.  They requested that I try to talk with Dr. Ubaldo Glassing and/or palliative care to see if we can expedite the process.  I told them that I will do my best to try to reach out to either team to see if we can expedite the process.  Danae Chen RN was at bedside for the conversation.  No further recommendations from me.  Surgery will sign off at this point please call us for any new questions or concerns

## 2019-05-02 NOTE — Progress Notes (Signed)
ANTICOAGULATION CONSULT NOTE - Initial Consult  Pharmacy Consult for Heparin Drip Indication: Transition from Warfarin in anticipation of possible surgery  Allergies  Allergen Reactions  . Insulins Other (See Comments)    Patient Measurements: Height: 5\' 7"  (170.2 cm) Weight: 195 lb (88.5 kg) IBW/kg (Calculated) : 66.1 Heparin Dosing Weight:  84.5 kg   Vital Signs: Temp: 98.4 F (36.9 C) (07/20 0512) Temp Source: Oral (07/20 0512) BP: 158/72 (07/20 0512) Pulse Rate: 69 (07/20 0512)  Labs: Recent Labs    04/29/19 0716 04/30/19 0302  04/30/19 2023 05/01/19 0238 05/02/19 0507  HGB 13.8 13.9  --   --  11.9* 11.9*  HCT 42.2 43.1  --   --  36.7* 36.6*  PLT 209 202  --   --  213 235  LABPROT 28.1* 20.0*  --   --   --  17.5*  INR 2.7* 1.7*  --   --   --  1.5*  HEPARINUNFRC  --   --    < > 0.42 0.39 0.32  CREATININE 0.96 1.06  --   --  0.85 0.78   < > = values in this interval not displayed.    Estimated Creatinine Clearance: 100.4 mL/min (by C-G formula based on SCr of 0.78 mg/dL).   Medical History: Past Medical History:  Diagnosis Date  . Carotid artery occlusion   . CHF (congestive heart failure) ([place])   . Diabetes mellitus without complication ([place])   . Hyperlipidemia   . Hypertension   . Stroke [facility])     Medications:  Scheduled:  . dextrose      . atorvastatin  40 mg Oral q1800  . carvedilol  12.5 mg Oral BID WC  . dextrose  25 mL Intravenous Once  . dofetilide  500 mcg Oral BID  . finasteride  5 mg Oral Daily  . gemfibrozil  600 mg Oral BID AC   Infusions:  . sodium chloride 250 mL (05/02/19 0521)  . 0.9 % NaCl with KCl 20 mEq / L 75 mL/hr at 05/01/19 2355  . heparin 1,350 Units/hr (05/02/19 0400)  . piperacillin-tazobactam (ZOSYN)  IV 3.375 g (05/02/19 0522)    Assessment: Pharmacy consulted for heparin drip management for 63 yo male admitted with acute cholecystitis and planned surgical intervention early next week. Patient takes warfarin due  to atrial fibrillation as an outpatient. INR is 1.7 today and patient was been initiated on heparin infusion at 1150 units/hr.  7/18 -  Drip initiated 1150 units/hr 7/18 1221 HL 0.14 - 2500 unit bolus, Rate inc to 1350 units/hr 7/18 2023 HL 0.42 - Continue 1350 units/hr   Goal of Therapy:  Heparin level 0.3-0.7 units/ml Monitor platelets by anticoagulation protocol: Yes   Plan:  07/20 @ 0500 HL 0.32 therapeutic. Will continue current rate and will recheck rate w/ am labs, CBC has dropped from B/L of 13.8 to 11.9 but stable. Will continue to monitor.  Tobie Lords, PharmD, BCPS Clinical Pharmacist 05/02/2019 7:05 AM

## 2019-05-02 NOTE — Progress Notes (Signed)
Pharmacy Antibiotic Note  Michael Patrick is a 63 y.o. male admitted on 04/28/2019 with Intra-abdominal infection.  Pharmacy has been consulted for Zosyn dosing. ?poss acute cholecystitis.  Day# 5: Zosyn   Plan: Zosyn 3.375g IV q8h (4 hour infusion).  Height: 5\' 7"  (170.2 cm) Weight: 195 lb (88.5 kg) IBW/kg (Calculated) : 66.1  Temp (24hrs), Avg:98.8 F (37.1 C), Min:98.4 F (36.9 C), Max:99.1 F (37.3 C)  Recent Labs  Lab 04/28/19 1139 04/28/19 1236 04/28/19 1535 04/29/19 0716 04/30/19 0302 05/01/19 0238 05/02/19 0507  WBC 19.8*  --   --  22.2* 20.6* 19.5* 10.0  CREATININE 1.06  --   --  0.96 1.06 0.85 0.78  LATICACIDVEN  --  2.0* 1.7  --   --   --   --     Estimated Creatinine Clearance: 100.4 mL/min (by C-G formula based on SCr of 0.78 mg/dL).    Allergies  Allergen Reactions  . Insulins Other (See Comments)    Antimicrobials this admission: Zosyn  7/16 >>   Vanc x 1 in ER 7/16    Microbiology results: 7/16 BCx: NGTD   MRSA PCR:  Negative   Thank you for allowing pharmacy to be a part of this patient's care.  Kimaria Struthers A 05/02/2019 10:40 AM

## 2019-05-02 NOTE — Progress Notes (Signed)
[place] [facility] [place] NAME: Michael Patrick    MR#:  [id number]  DATE OF BIRTH:  Mar 02, 1956  SUBJECTIVE:   Patient admitted to the hospital due to diffuse abdominal pain and and has acute cholecystitis.   Patient still complaining of generalized abdominal pain. Awaiting IR to place drain today--per Dr Lysle Pearl  REVIEW OF SYSTEMS:    Review of Systems  Constitutional: Negative for chills and fever.  HENT: Negative for congestion and tinnitus.   Eyes: Negative for blurred vision and double vision.  Respiratory: Negative for cough, shortness of breath and wheezing.   Cardiovascular: Negative for chest pain, orthopnea and PND.  Gastrointestinal: Positive for abdominal pain. Negative for diarrhea, nausea and vomiting.  Genitourinary: Negative for dysuria and hematuria.  Neurological: Negative for dizziness, sensory change and focal weakness.  All other systems reviewed and are negative.  Nutrition: NPO  DRUG ALLERGIES:   Allergies  Allergen Reactions  . Insulins Other (See Comments)    VITALS:  Blood pressure (!) 161/73, pulse 69, temperature 98.4 F (36.9 C), temperature source Oral, resp. rate 20, height 5\' 7"  (1.702 m), weight 88.5 kg, SpO2 100 %.  PHYSICAL EXAMINATION:   Physical Exam  GENERAL:  63 y.o.-year-old patient lying in bed in no acute distress.  EYES: Pupils equal, round, reactive to light and accommodation. No scleral icterus. Extraocular muscles intact.  HEENT: Head atraumatic, normocephalic. Oropharynx and nasopharynx clear.  NECK:  Supple, no jugular venous distention. No thyroid enlargement, no tenderness.  LUNGS: Normal breath sounds bilaterally, no wheezing, rales, rhonchi. No use of accessory muscles of respiration.  CARDIOVASCULAR: S1, S2 RRR , + metallic valve click . No murmurs, rubs, or gallops.  ABDOMEN: Soft, Tender diffusely, no rebound, rigidity, nondistended. Bowel sounds present. No organomegaly or  mass.  EXTREMITIES: No cyanosis, clubbing or edema b/l.    NEUROLOGIC: Cranial nerves II through XII are intact. No focal Motor or sensory deficits b/l.   PSYCHIATRIC: patient is alert and oriented x 3.  SKIN: No obvious rash, lesion, or ulcer.    LABORATORY PANEL:   CBC Recent Labs  Lab 05/02/19 0507  WBC 10.0  HGB 11.9*  HCT 36.6*  PLT 235   ------------------------------------------------------------------------------------------------------------------  Chemistries  Recent Labs  Lab 05/02/19 0507  NA 138  K 3.9  CL 107  CO2 24  GLUCOSE 67*  BUN 18  CREATININE 0.78  CALCIUM 8.7*  MG 2.2  AST 122*  ALT 97*  ALKPHOS 303*  BILITOT 0.9   ------------------------------------------------------------------------------------------------------------------  Cardiac Enzymes No results for input(s): TROPONINI in the last 168 hours. ------------------------------------------------------------------------------------------------------------------  RADIOLOGY:  No results found. ASSESSMENT AND PLAN:   63 year old male with past medical history of hypertension, hyperlipidemia, diabetes, CHF, history of previous CVA, carotid artery occlusion, hx of st. Jude's aortic valve who presented to the hospital due to abdominal pain and suspected to have acute cholecystitis.  1. Acute cholecystitis-this is the diagnosis given patient's abdominal pain and ultrasound findings. - Seen by general surgery and confirmed it by doing a HIDA scan. - Continue empiric antibiotics with Zosyn. -WBC 22K--10K - IR to place drain today per Dr Lysle Pearl -pt is at high risk for surgery -LTF's elevated  2. History of atrial fibrillation- rate controlled. -Continue carvedilol, continue Tikosyn.   -Coumadin on hold  due to plans for Drain today -Holding IV heparin till procedure is done  3.  Acquired coagulopathy-secondary to the Coumadin. - INR today is 1.5. received  po 5mg  vitamin K -Pharmacy  managing heparin drip  4.  History of St. Jude's aortic valve- patient is on Coumadin but coagulopathy needs to be reversed for surgery. - now on IV heparin gtt--holding today for procedure  5.  Hyperlipidemia- hold  Atorvastatin--LFT's up  6.  BPH- continue finasteride.   All the records are reviewed and case discussed with Care Management/Social Worker. Management plans discussed with the patient  Wife has been discussing plans with Dr Lysle Pearl  CODE STATUS: Full code  DVT Prophylaxis: Coumadin/heparin gtt  TOTAL TIME TAKING CARE OF THIS PATIENT: 30 minutes.   POSSIBLE D/C IN few DAYS, DEPENDING ON CLINICAL CONDITION.   Fritzi Mandes M.D on 05/02/2019 at 8:25 AM  Between 7am to 6pm - Pager - [phone] After 6pm go to [email or web address] - Proofreader  [facility]  Office  [phone]  CC: Primary care physician; Rusty Aus, MD

## 2019-05-02 NOTE — Progress Notes (Signed)
Subjective:  CC: Michael Patrick is a 63 y.o. male  Hospital stay day 4,  Acute choelcystitis  HPI: No issues overnight.  Still having pain.  Mentioned increased diarrhea  ROS:  General: Denies weight loss, weight gain, fatigue, fevers, chills, and night sweats. Heart: Denies chest pain, palpitations, racing heart, irregular heartbeat, leg pain or swelling, and decreased activity tolerance. Respiratory: Denies breathing difficulty, shortness of breath, wheezing, cough, and sputum. GI: Denies change in appetite, heartburn, nausea, vomiting, constipation, blood in stool. GU: Denies difficulty urinating, pain with urinating, urgency, frequency, blood in urine.   Objective:   Temp:  [98.4 F (36.9 C)-99.1 F (37.3 C)] 98.4 F (36.9 C) (07/20 0512) Pulse Rate:  [56-70] 69 (07/20 0753) Resp:  [16-20] 20 (07/20 0512) BP: (129-161)/(72-97) 161/73 (07/20 0753) SpO2:  [97 %-100 %] 100 % (07/20 0512)     Height: 5\' 7"  (170.2 cm) Weight: 88.5 kg BMI (Calculated): 30.53   Intake/Output this shift:   Intake/Output Summary (Last 24 hours) at 05/02/2019 0813 Last data filed at 05/02/2019 7209 Gross per 24 hour  Intake 1658.56 ml  Output 0 ml  Net 1658.56 ml    Constitutional :  alert, cooperative, appears stated age and no distress  Respiratory:  clear to auscultation bilaterally  Cardiovascular:  regular rate and rhythm  Gastrointestinal: soft, no guarding, focal tenderness in epigastric, and RUQ again.   Skin: Cool and moist.   Psychiatric: Normal affect, non-agitated, not confused       LABS:  CMP Latest Ref Rng & Units 05/02/2019 05/01/2019 04/30/2019  Glucose 70 - 99 mg/dL 67(L) 80 93  BUN 8 - 23 mg/dL 18 27(H) 32(H)  Creatinine 0.61 - 1.24 mg/dL 0.78 0.85 1.06  Sodium 135 - 145 mmol/L 138 138 136  Potassium 3.5 - 5.1 mmol/L 3.9 3.7 4.2  Chloride 98 - 111 mmol/L 107 107 101  CO2 22 - 32 mmol/L 24 21(L) 25  Calcium 8.9 - 10.3 mg/dL 8.7(L) 8.6(L) 8.8(L)  Total Protein 6.5 - 8.1  g/dL 6.6 6.7 7.0  Total Bilirubin 0.3 - 1.2 mg/dL 0.9 0.9 1.0  Alkaline Phos 38 - 126 U/L 303(H) 81 81  AST 15 - 41 U/L 122(H) 23 24  ALT 0 - 44 U/L 97(H) 13 12   CBC Latest Ref Rng & Units 05/02/2019 05/01/2019 04/30/2019  WBC 4.0 - 10.5 K/uL 10.0 19.5(H) 20.6(H)  Hemoglobin 13.0 - 17.0 g/dL 11.9(L) 11.9(L) 13.9  Hematocrit 39.0 - 52.0 % 36.6(L) 36.7(L) 43.1  Platelets 150 - 400 K/uL 235 213 202    RADS: n/a Assessment:   Acute cholecystitis With wife on the phone at patient bedside we again discussed the overall risk benefits alternatives to proceeding with surgery today.  INR is 1.5 and heparin drip was stopped this morning, so the bleeding risk should be minimalized at this point.  However, the increased perioperative risk from the subacute nature of this disease, along with the newly elevated LFTs, still makes the surgery a high risk procedure compared to a IR drain placement and then interval lap chole.  I explained to the wife and patient again that the tissue inflammation will be less of a factor of increasing perioperative complications if we proceed with the drain placement and do the interval lap chole.  I also stated that his other medical comorbidities will still remain a risk factor even if we perform the surgery at a later date.    In summary, I explained that the overall risk of  perioperative complications will be lower if we proceed with drain placement and interval lap chole versus proceeding with lap chole today.  This is due to the increased inflammatory and fragile tissue that will be encountered if we proceed today versus proceeding after drain placement.  The patient and wife this morning, is agreeable to drain placement now I have called interventional radiology to arrange a time today for placement.  If for whatever reason the drain placement is unsuccessful, we will have to proceed with surgery because there is no other treatment modalities available for him.  This was  also relayed to the patient and wife and they verbalized understanding.  In preparation for the unlikely but possible failure of drain placement leading to surgery, I explained to patient the possibility of the need to convert to a open cholecystectomy, which will involve longer hospitalization.  We again went over the remaining risks of surgery including but not limited to, post-op infxn, seroma, biloma, chronic pain, poor-delayed wound healing, retained gallstone, post-op SBO or ileus, and need for additional procedures to address said risks.  The risks of general anesthetic including MI, CVA, sudden death or even reaction to anesthetic medications also discussed.  Pt RN Danae Chen was at bedside for the majority of the conversation and she verbalized understanding of plan.  Dr. Posey Pronto, admitting provider, also notified of plan.  Leukocytosis- resolved  A. fib with history of pacemaker placement On Coumadin for anticoagulation -continue to hold heparin drip and coumadin until drain placement is completed  History of TIA back in January Prosthetic aortic valve replacement- goal INR between 2 and 3 Chronic systolic congestive heart failure Carotid artery disease

## 2019-05-02 NOTE — Progress Notes (Signed)
Patient ID: Michael Patrick, male   DOB: 1956-01-17, 63 y.o.   MRN: [id number]  [place] with patient's wife at length. Wife has discussed with patient and his wishes are not doing any further treatment for the gallbladder infection including gallbladder drained and or surgery. Patient in any case is a very high risk for surgery and not a suitable candidate for Dr. Lysle Pearl. Patient also wishes to turn off his pacemaker. He and wife understands the risk and complications involved and patient expresses wishes to die with natural causes. I have discussed with wife. Patient is a DNR. I have also discussed option of home with hospice versus hospice facility. Wife is going to talk with [name] and let me know.  Dr. Lysle Pearl is aware of above.

## 2019-05-03 ENCOUNTER — Inpatient Hospital Stay: Payer: Medicare Other

## 2019-05-03 LAB — HEPARIN LEVEL (UNFRACTIONATED)
Heparin Unfractionated: <0.1 IU/mL — ABNORMAL LOW (ref 0.30–0.70)
Heparin Unfractionated: 0.26 IU/mL — ABNORMAL LOW (ref 0.30–0.70)

## 2019-05-03 LAB — MAGNESIUM: Magnesium: 1.9 mg/dL (ref 1.7–2.4)

## 2019-05-03 LAB — HEPATIC FUNCTION PANEL
ALT: 188 U/L — ABNORMAL HIGH (ref 0–44)
AST: 201 U/L — ABNORMAL HIGH (ref 15–41)
Albumin: 2.9 g/dL — ABNORMAL LOW (ref 3.5–5.0)
Alkaline Phosphatase: 578 U/L — ABNORMAL HIGH (ref 38–126)
Bilirubin, Direct: 0.1 mg/dL (ref 0.0–0.2)
Indirect Bilirubin: 0.5 mg/dL (ref 0.3–0.9)
Total Bilirubin: 0.6 mg/dL (ref 0.3–1.2)
Total Protein: 6.4 g/dL — ABNORMAL LOW (ref 6.5–8.1)

## 2019-05-03 LAB — CBC
HCT: 36.1 % — ABNORMAL LOW (ref 39.0–52.0)
Hemoglobin: 11.8 g/dL — ABNORMAL LOW (ref 13.0–17.0)
MCH: 28 pg (ref 26.0–34.0)
MCHC: 32.7 g/dL (ref 30.0–36.0)
MCV: 85.7 fL (ref 80.0–100.0)
Platelets: 283 10*3/uL (ref 150–400)
RBC: 4.21 MIL/uL — ABNORMAL LOW (ref 4.22–5.81)
RDW: 14.4 % (ref 11.5–15.5)
WBC: 10 10*3/uL (ref 4.0–10.5)
nRBC: 0 % (ref 0.0–0.2)

## 2019-05-03 LAB — CULTURE, BLOOD (ROUTINE X 2)
Culture: NO GROWTH
Culture: NO GROWTH

## 2019-05-03 LAB — GLUCOSE, CAPILLARY
Glucose-Capillary: 291 mg/dL — ABNORMAL HIGH (ref 70–99)
Glucose-Capillary: 267 mg/dL — ABNORMAL HIGH (ref 70–99)
Glucose-Capillary: 204 mg/dL — ABNORMAL HIGH (ref 70–99)

## 2019-05-03 LAB — TROPONIN I (HIGH SENSITIVITY)
Troponin I (High Sensitivity): 74 ng/L — ABNORMAL HIGH (ref <18)
Troponin I (High Sensitivity): 73 ng/L — ABNORMAL HIGH (ref <18)

## 2019-05-03 LAB — PHOSPHORUS: Phosphorus: 2.2 mg/dL — ABNORMAL LOW (ref 2.5–4.6)

## 2019-05-03 LAB — PROTIME-INR
INR: 1.3 — ABNORMAL HIGH (ref 0.8–1.2)
Prothrombin Time: 16 seconds — ABNORMAL HIGH (ref 11.4–15.2)

## 2019-05-03 LAB — BASIC METABOLIC PANEL
Anion gap: 9 (ref 5–15)
BUN: 17 mg/dL (ref 8–23)
CO2: 22 mmol/L (ref 22–32)
Calcium: 8.4 mg/dL — ABNORMAL LOW (ref 8.9–10.3)
Chloride: 104 mmol/L (ref 98–111)
Creatinine, Ser: 0.8 mg/dL (ref 0.61–1.24)
GFR calc Af Amer: >60 mL/min (ref >60)
GFR calc non Af Amer: >60 mL/min (ref >60)
Glucose, Bld: 327 mg/dL — ABNORMAL HIGH (ref 70–99)
Potassium: 3.7 mmol/L (ref 3.5–5.1)
Sodium: 135 mmol/L (ref 135–145)

## 2019-05-03 LAB — HEMOGLOBIN A1C
Hgb A1c MFr Bld: 11.5 % — ABNORMAL HIGH (ref 4.8–5.6)
Mean Plasma Glucose: 283.35 mg/dL

## 2019-05-03 LAB — APTT: aPTT: 45 seconds — ABNORMAL HIGH (ref 24–36)

## 2019-05-03 MED ORDER — K PHOS MONO-SOD PHOS DI & MONO 155-852-130 MG PO TABS
500.0000 mg | ORAL_TABLET | ORAL | Status: AC
  Filled 2019-05-03 (×2): qty 2

## 2019-05-03 MED ORDER — MAGNESIUM OXIDE 400 (241.3 MG) MG PO TABS
200.0000 mg | ORAL_TABLET | Freq: Every day | ORAL | Status: DC
  Administered 2019-05-03: 200 mg via ORAL
  Filled 2019-05-03 (×2): qty 1

## 2019-05-03 MED ORDER — WARFARIN SODIUM 7.5 MG PO TABS
7.5000 mg | ORAL_TABLET | Freq: Once | ORAL | Status: AC
  Administered 2019-05-03: 7.5 mg via ORAL
  Filled 2019-05-03 (×2): qty 1

## 2019-05-03 MED ORDER — HEPARIN (PORCINE) 25000 UT/250ML-% IV SOLN
1600.0000 [IU]/h | INTRAVENOUS | Status: DC
  Administered 2019-05-03: 1350 [IU]/h via INTRAVENOUS
  Administered 2019-05-04: 1600 [IU]/h via INTRAVENOUS
  Filled 2019-05-03 (×2): qty 250

## 2019-05-03 MED ORDER — MIDAZOLAM HCL 5 MG/5ML IJ SOLN
INTRAMUSCULAR | Status: AC
  Filled 2019-05-03: qty 5

## 2019-05-03 MED ORDER — FENTANYL CITRATE (PF) 100 MCG/2ML IJ SOLN
INTRAMUSCULAR | Status: AC
  Filled 2019-05-03: qty 2

## 2019-05-03 MED ORDER — HYDRALAZINE HCL 20 MG/ML IJ SOLN
5.0000 mg | Freq: Once | INTRAMUSCULAR | Status: AC
  Administered 2019-05-03: 5 mg via INTRAVENOUS
  Filled 2019-05-03: qty 1

## 2019-05-03 MED ORDER — INSULIN ASPART 100 UNIT/ML ~~LOC~~ SOLN
0.0000 [IU] | Freq: Three times a day (TID) | SUBCUTANEOUS | Status: DC
  Administered 2019-05-03: 5 [IU] via SUBCUTANEOUS
  Filled 2019-05-03: qty 1

## 2019-05-03 MED ORDER — NITROGLYCERIN 0.4 MG SL SUBL: 0.4000 mg | SUBLINGUAL_TABLET | SUBLINGUAL | Status: DC | PRN

## 2019-05-03 MED ORDER — HYDROMORPHONE HCL 1 MG/ML IJ SOLN
INTRAMUSCULAR | Status: AC
  Filled 2019-05-03: qty 0.5

## 2019-05-03 MED ORDER — POTASSIUM CHLORIDE CRYS ER 20 MEQ PO TBCR
20.0000 meq | EXTENDED_RELEASE_TABLET | Freq: Once | ORAL | Status: AC
  Administered 2019-05-03: 20 meq via ORAL
  Filled 2019-05-03: qty 1

## 2019-05-03 MED ORDER — DOFETILIDE 250 MCG PO CAPS
500.0000 ug | ORAL_CAPSULE | Freq: Two times a day (BID) | ORAL | Status: DC
  Administered 2019-05-03 – 2019-05-04 (×3): 500 ug via ORAL
  Filled 2019-05-03 (×4): qty 2

## 2019-05-03 MED ORDER — FINASTERIDE 5 MG PO TABS
5.0000 mg | ORAL_TABLET | Freq: Every day | ORAL | Status: DC
  Administered 2019-05-03 – 2019-05-04 (×2): 5 mg via ORAL
  Filled 2019-05-03 (×2): qty 1

## 2019-05-03 MED ORDER — WARFARIN SODIUM 10 MG PO TABS
10.0000 mg | ORAL_TABLET | Freq: Once | ORAL | Status: DC
  Filled 2019-05-03: qty 1

## 2019-05-03 MED ORDER — WARFARIN - PHARMACIST DOSING INPATIENT: Freq: Every day | Status: DC

## 2019-05-03 MED ORDER — MORPHINE SULFATE (PF) 2 MG/ML IV SOLN
2.0000 mg | Freq: Once | INTRAVENOUS | Status: AC
  Administered 2019-05-03: 2 mg via INTRAVENOUS
  Filled 2019-05-03: qty 1

## 2019-05-03 MED ORDER — HYDRALAZINE HCL 20 MG/ML IJ SOLN
10.0000 mg | Freq: Once | INTRAMUSCULAR | Status: AC
  Administered 2019-05-03: 02:00:00 10 mg via INTRAVENOUS
  Filled 2019-05-03: qty 1

## 2019-05-03 MED ORDER — GABAPENTIN 300 MG PO CAPS
300.0000 mg | ORAL_CAPSULE | Freq: Two times a day (BID) | ORAL | Status: DC
  Administered 2019-05-03 – 2019-05-04 (×3): 300 mg via ORAL
  Filled 2019-05-03 (×3): qty 1

## 2019-05-03 MED ORDER — HEPARIN BOLUS VIA INFUSION
1200.0000 [IU] | Freq: Once | INTRAVENOUS | Status: AC
  Administered 2019-05-03: 1200 [IU] via INTRAVENOUS
  Filled 2019-05-03: qty 1200

## 2019-05-03 MED ORDER — FENTANYL CITRATE (PF) 100 MCG/2ML IJ SOLN
INTRAMUSCULAR | Status: AC | PRN
  Administered 2019-05-03 (×2): 50 ug via INTRAVENOUS

## 2019-05-03 MED ORDER — INSULIN ASPART 100 UNIT/ML ~~LOC~~ SOLN
0.0000 [IU] | Freq: Every day | SUBCUTANEOUS | Status: DC
  Administered 2019-05-03: 02:00:00 3 [IU] via SUBCUTANEOUS
  Filled 2019-05-03: qty 1

## 2019-05-03 MED ORDER — MIDAZOLAM HCL 5 MG/5ML IJ SOLN
INTRAMUSCULAR | Status: AC | PRN
  Administered 2019-05-03: 1 mg via INTRAVENOUS

## 2019-05-03 MED ORDER — SODIUM CHLORIDE 0.9% FLUSH
5.0000 mL | Freq: Three times a day (TID) | INTRAVENOUS | Status: DC
  Administered 2019-05-03 – 2019-05-04 (×3): 5 mL

## 2019-05-03 MED ORDER — PIPERACILLIN-TAZOBACTAM 3.375 G IVPB
3.3750 g | Freq: Three times a day (TID) | INTRAVENOUS | Status: DC
  Administered 2019-05-03 – 2019-05-04 (×4): 3.375 g via INTRAVENOUS
  Filled 2019-05-03 (×4): qty 50

## 2019-05-03 MED ORDER — CARVEDILOL 12.5 MG PO TABS
12.5000 mg | ORAL_TABLET | Freq: Two times a day (BID) | ORAL | Status: DC
  Administered 2019-05-03 – 2019-05-04 (×2): 12.5 mg via ORAL
  Filled 2019-05-03 (×2): qty 1

## 2019-05-03 MED ORDER — PANTOPRAZOLE SODIUM 40 MG PO TBEC
40.0000 mg | DELAYED_RELEASE_TABLET | Freq: Every day | ORAL | Status: DC
  Administered 2019-05-04: 40 mg via ORAL
  Filled 2019-05-03: qty 1

## 2019-05-03 NOTE — Procedures (Signed)
Interventional Radiology Procedure Note  Procedure: CT guided cholecystostomy tube placement  Complications: None  Estimated Blood Loss: < 10 mL  Findings: Fluid in GB lumen is high density by CT with aspiration confirming grossly hemorrhagic fluid and clots. 10 Fr drain placed in GB lumen. Attached to gravity bag. Sample of bloody fluid sent for culture. Drain flushing orders placed.  Venetia Night. Kathlene Cote, M.D Pager:  [phone]

## 2019-05-03 NOTE — Progress Notes (Signed)
Patient ID: Michael Patrick, male   DOB: 29-Mar-1956, 63 y.o.   MRN: [id number] patient had gallbladder drain placed. It showed blood clots with blood he returned. Patient has a metallic valve needs to be on IV heparin drip and Coumadin for chronic long-term anticoagulation. I will check hemoglobin. Will resume heparin drip. Once hemoglobin remains stable will start patient back on Coumadin. D/w pharmacy

## 2019-05-03 NOTE — Progress Notes (Signed)
ANTICOAGULATION CONSULT NOTE - Initial Consult  Pharmacy Consult for Heparin Drip Indication: Transition back to Warfarin for A Fib  No Active Allergies  Patient Measurements: Height: 5\' 7"  (170.2 cm) Weight: 195 lb (88.5 kg) IBW/kg (Calculated) : 66.1 Heparin Dosing Weight:  84.5 kg   Vital Signs: Temp: 100.7 F (38.2 C) (07/21 2210) Temp Source: Oral (07/21 2210) BP: 175/74 (07/21 2210) Pulse Rate: 69 (07/21 2210)  Labs: Recent Labs    05/01/19 0238 05/02/19 0507 05/03/19 0021 05/03/19 0207 05/03/19 1457 05/03/19 2214  HGB 11.9* 11.9*  --   --  11.8*  --   HCT 36.7* 36.6*  --   --  36.1*  --   PLT 213 235  --   --  283  --   APTT  --   --   --   --  45*  --   LABPROT  --  17.5*  --   --  16.0*  --   INR  --  1.5*  --   --  1.3*  --   HEPARINUNFRC 0.39 0.32  --  <0.10*  --  0.26*  CREATININE 0.85 0.78 0.80  --   --   --   TROPONINIHS  --   --  74* 73*  --   --     Estimated Creatinine Clearance: 100.4 mL/min (by C-G formula based on SCr of 0.8 mg/dL).   Medical History: Past Medical History:  Diagnosis Date  . Carotid artery occlusion   . CHF (congestive heart failure) ([place])   . Diabetes mellitus without complication ([place])   . Hyperlipidemia   . Hypertension   . Stroke [facility])     Medications:  Scheduled:  . carvedilol  12.5 mg Oral BID WC  . dofetilide  500 mcg Oral BID  . fentaNYL      . finasteride  5 mg Oral Daily  . gabapentin  300 mg Oral BID  . HYDROmorphone      . insulin aspart  0-15 Units Subcutaneous TID WC  . insulin aspart  0-5 Units Subcutaneous QHS  . magnesium oxide  200 mg Oral Daily  . midazolam      . pantoprazole  40 mg Oral Daily  . sodium chloride flush  5 mL Intracatheter Q8H  . Warfarin - Pharmacist Dosing Inpatient   Does not apply q1800   Infusions:  . heparin 1,350 Units/hr (05/03/19 1847)  . piperacillin-tazobactam (ZOSYN)  IV 3.375 g (05/03/19 2113)    Assessment: Pharmacy consulted for heparin drip management for  63 yo male admitted with acute cholecystitis and planned surgical intervention early next week. Patient takes warfarin due to atrial fibrillation as an outpatient. INR is 1.7 today and patient was been initiated on heparin infusion at 1150 units/hr.  7/18 -  Drip initiated 1150 units/hr 7/18 1221 HL 0.14 - 2500 unit bolus, Rate inc to 1350 units/hr 7/18 2023 HL 0.42 - Continue 1350 units/hr  7/21 1457 INR 1.3   -  Will give Warfarin 7.5mg  this evening (home dose 5mg )  Heparin held due to procedure and now being restarted at same rate without bolus unit INR therapeutic  Goal of Therapy:  Heparin level 0.3-0.7 units/ml Monitor platelets by anticoagulation protocol: Yes   Plan:  7/21 @  2214 HL: 0.26. Level is subtherapeutic. Will order heparin 1200 unit bolus and increase heparin infusion to 1500 unit/hr. Recheck HL  6 hours after infusion rate change.  CBC ordered with AM labs.  Pernell Dupre, PharmD, BCPS Clinical Pharmacist 05/03/2019 11:23 PM

## 2019-05-03 NOTE — Progress Notes (Signed)
At bedside to start PIV, patient with multiple comments as I entered room and then assessed for IV.  Family member at bedside then stepped in to re-explain with I was there to do including the fact that I would not be taking current IV out.  Patient again asking why I was not taking current IV out, "You have an answer for everything!" as I answered his questions.  Finally after initial stick patient told me to take IV out.  Misty, RN updated/  Carolee Rota, RN

## 2019-05-03 NOTE — Progress Notes (Signed)
[place] at [place] NAME: Michael Patrick    MR#:  [id number]  DATE OF BIRTH:  10-27-1955  SUBJECTIVE:   Patient admitted to the hospital due to diffuse abdominal pain and and has acute cholecystitis.    Long discussion with patient and wife in the room by me and Dr. Lysle Pearl mentioned below REVIEW OF SYSTEMS:    Review of Systems  Constitutional: Negative for chills and fever.  HENT: Negative for congestion and tinnitus.   Eyes: Negative for blurred vision and double vision.  Respiratory: Negative for cough, shortness of breath and wheezing.   Cardiovascular: Negative for chest pain, orthopnea and PND.  Gastrointestinal: Positive for abdominal pain. Negative for diarrhea, nausea and vomiting.  Genitourinary: Negative for dysuria and hematuria.  Neurological: Negative for dizziness, sensory change and focal weakness.  All other systems reviewed and are negative.  Nutrition: NPO  DRUG ALLERGIES:   No Active Allergies  VITALS:  Blood pressure (!) 168/65, pulse 73, temperature 98.9 F (37.2 C), temperature source Oral, resp. rate 20, height 5\' 7"  (1.702 m), weight 88.5 kg, SpO2 100 %.  PHYSICAL EXAMINATION:   Physical Exam  GENERAL:  63 y.o.-year-old patient lying in bed in no acute distress. obese EYES: Pupils equal, round, reactive to light and accommodation. No scleral icterus. Extraocular muscles intact.  HEENT: Head atraumatic, normocephalic. Oropharynx and nasopharynx clear.  NECK:  Supple, no jugular venous distention. No thyroid enlargement, no tenderness.  LUNGS: Normal breath sounds bilaterally, no wheezing, rales, rhonchi. No use of accessory muscles of respiration.  CARDIOVASCULAR: S1, S2 RRR , + metallic valve click . No murmurs, rubs, or gallops.  ABDOMEN: Soft, Tender diffusely, no rebound, rigidity, nondistended. Bowel sounds present. No organomegaly or mass.  EXTREMITIES: No cyanosis, clubbing or edema b/l.     NEUROLOGIC: Cranial nerves II through XII are intact. No focal Motor or sensory deficits b/l.   PSYCHIATRIC: patient is alert and oriented x 3.  SKIN: No obvious rash, lesion, or ulcer.    LABORATORY PANEL:   CBC Recent Labs  Lab 05/02/19 0507  WBC 10.0  HGB 11.9*  HCT 36.6*  PLT 235   ------------------------------------------------------------------------------------------------------------------  Chemistries  Recent Labs  Lab 05/03/19 0021 05/03/19 0207  NA 135  --   K 3.7  --   CL 104  --   CO2 22  --   GLUCOSE 327*  --   BUN 17  --   CREATININE 0.80  --   CALCIUM 8.4*  --   MG  --  1.9  AST  --  201*  ALT  --  188*  ALKPHOS  --  578*  BILITOT  --  0.6   ------------------------------------------------------------------------------------------------------------------  Cardiac Enzymes No results for input(s): TROPONINI in the last 168 hours. ------------------------------------------------------------------------------------------------------------------  RADIOLOGY:  Dg Chest 1 View  Result Date: 05/03/2019 CLINICAL DATA:  Shortness of breath EXAM: CHEST  1 VIEW COMPARISON:  None. FINDINGS: There is a leftchest wall 4 lead AICD. The heart size is mildly enlarged. Both lungs are clear. The visualized skeletal structures are unremarkable. IMPRESSION: No active disease. Electronically Signed   By: Ulyses Jarred M.D.   On: 05/03/2019 01:06   ASSESSMENT AND PLAN:   63 year old male with past medical history of hypertension, hyperlipidemia, diabetes, CHF, history of previous CVA, carotid artery occlusion, hx of st. Jude's aortic valve who presented to the hospital due to abdominal pain and suspected to have acute cholecystitis.  1. Acute cholecystitis - Seen by general surgery and confirmed it by doing a HIDA scan. - Continue empiric antibiotics with Zosyn. -WBC 22K--10K - IR to place drain today. D/w Dr Kathlene Cote. -Long discussion was made with Mr. Aull and his  wife in the room. Mr. Deal has changed his mind and now wants a GB drian placed . It was mentioned to him that the drain may stay permanent if we cannot have another surgeon to remove his gallbladder since he is at a very high risk for surgery given his medical co-morbidities He and his wife does understand the risk and benefits. The other option given to him is do nothing and let palliative care help out with goals of care which he wanted yesterday. Mr. Oettinger has decided for drain placement. He will be NPO. -will hold off IV heparin gtt till the drain is placed and then start it back with to bridge coumadin  -LTF's elevated  2. History of atrial fibrillation- rate controlled. -Continue carvedilol, continue Tikosyn.   -Coumadin on hold  due to plans for Drain today -Holding IV heparin till procedure is done  3.  Acquired coagulopathy-secondary to the Coumadin. - INR today is 1.5.K -Pharmacy managing heparin drip  4.  History of St. Jude's aortic valve- patient is on Coumadin but coagulopathy needs to be reversed for surgery. - now on IV heparin gtt--holding today for procedure  5.  Hyperlipidemia- hold  Atorvastatin--LFT's up  6.  BPH- continue finasteride.   All the records are reviewed and case discussed with Care Management/Social Worker. Management plans discussed with the patient and wife in the room  Dr Lysle Pearl and [facility] RN were present during the above conversation  CODE STATUS: Full code  DVT Prophylaxis: SCD  TOTAL TIME TAKING CARE OF THIS PATIENT: 30 minutes.   POSSIBLE D/C IN few DAYS, DEPENDING ON CLINICAL CONDITION.   Fritzi Mandes M.D on 05/03/2019 at 8:03 AM  Between 7am to 6pm - Pager - [phone] After 6pm go to [email or web address] - Proofreader  [facility]  Office  [phone]  CC: Primary care physician; Rusty Aus, MD

## 2019-05-03 NOTE — Progress Notes (Signed)
Pharmacy Antibiotic Note  Michael Patrick is a 63 y.o. male admitted on 04/28/2019 with Intra-abdominal infection.  Pharmacy has been consulted for Zosyn dosing. ?poss acute cholecystitis.  Day# 6: Zosyn   Plan: Zosyn 3.375g IV q8h (4 hour infusion).  Height: 5\' 7"  (170.2 cm) Weight: 195 lb (88.5 kg) IBW/kg (Calculated) : 66.1  Temp (24hrs), Avg:99.1 F (37.3 C), Min:98.9 F (37.2 C), Max:99.4 F (37.4 C)  Recent Labs  Lab 04/28/19 1139 04/28/19 1236 04/28/19 1535 04/29/19 0716 04/30/19 0302 05/01/19 0238 05/02/19 0507 05/03/19 0021  WBC 19.8*  --   --  22.2* 20.6* 19.5* 10.0  --   CREATININE 1.06  --   --  0.96 1.06 0.85 0.78 0.80  LATICACIDVEN  --  2.0* 1.7  --   --   --   --   --     Estimated Creatinine Clearance: 100.4 mL/min (by C-G formula based on SCr of 0.8 mg/dL).    No Active Allergies  Antimicrobials this admission: Zosyn  7/16 >>   (dose held 7/20 after am dose due to level of care discussion, but now resumed am 7/21 per discussion with Dr Fritzi Mandes)  Vanc x 1 in ER 7/16    Microbiology results: 7/16 BCx: NGTD   MRSA PCR:  Negative   Thank you for allowing pharmacy to be a part of this patient's care.  Lu Duffel, PharmD, BCPS Clinical Pharmacist 05/03/2019 7:51 AM

## 2019-05-03 NOTE — Progress Notes (Addendum)
ANTICOAGULATION CONSULT NOTE - Initial Consult  Pharmacy Consult for Heparin Drip Indication: Transition back to Warfarin for A Fib  No Active Allergies  Patient Measurements: Height: 5\' 7"  (170.2 cm) Weight: 195 lb (88.5 kg) IBW/kg (Calculated) : 66.1 Heparin Dosing Weight:  84.5 kg   Vital Signs: Temp: 97.6 F (36.4 C) (07/21 1347) Temp Source: Oral (07/21 1347) BP: 181/85 (07/21 1347) Pulse Rate: 78 (07/21 1347)  Labs: Recent Labs    05/01/19 0238 05/02/19 0507 05/03/19 0021 05/03/19 0207  HGB 11.9* 11.9*  --   --   HCT 36.7* 36.6*  --   --   PLT 213 235  --   --   LABPROT  --  17.5*  --   --   INR  --  1.5*  --   --   HEPARINUNFRC 0.39 0.32  --  <0.10*  CREATININE 0.85 0.78 0.80  --   TROPONINIHS  --   --  74* 73*    Estimated Creatinine Clearance: 100.4 mL/min (by C-G formula based on SCr of 0.8 mg/dL).   Medical History: Past Medical History:  Diagnosis Date  . Carotid artery occlusion   . CHF (congestive heart failure) ([place])   . Diabetes mellitus without complication ([place])   . Hyperlipidemia   . Hypertension   . Stroke [facility])     Medications:  Scheduled:  . carvedilol  12.5 mg Oral BID WC  . dofetilide  500 mcg Oral BID  . fentaNYL      . finasteride  5 mg Oral Daily  . gabapentin  300 mg Oral BID  . HYDROmorphone      . insulin aspart  0-15 Units Subcutaneous TID WC  . insulin aspart  0-5 Units Subcutaneous QHS  . magnesium oxide  200 mg Oral Daily  . midazolam      . pantoprazole  40 mg Oral Daily  . potassium chloride  20 mEq Oral Once  . sodium chloride flush  5 mL Intracatheter Q8H   Infusions:  . piperacillin-tazobactam (ZOSYN)  IV 3.375 g (05/03/19 0829)    Assessment: Pharmacy consulted for heparin drip management for 63 yo male admitted with acute cholecystitis and planned surgical intervention early next week. Patient takes warfarin due to atrial fibrillation as an outpatient. INR is 1.7 today and patient was been initiated on  heparin infusion at 1150 units/hr.  7/18 -  Drip initiated 1150 units/hr 7/18 1221 HL 0.14 - 2500 unit bolus, Rate inc to 1350 units/hr 7/18 2023 HL 0.42 - Continue 1350 units/hr  7/21 1457 INR 1.3   -  Will give Warfarin 7.5mg  this evening (home dose 5mg )  Heparin held due to procedure and now being restarted at same rate without bolus unit INR therapeutic    Goal of Therapy:  Heparin level 0.3-0.7 units/ml Monitor platelets by anticoagulation protocol: Yes   Plan:  Re-obtain INR and CBC (not obtained this am) and will utilize heparin drip to bridge back to warfarin   Lu Duffel, PharmD, BCPS Clinical Pharmacist 05/03/2019 2:10 PM

## 2019-05-03 NOTE — Progress Notes (Signed)
Pharmacy Electrolyte Monitoring Consult:  Pharmacy consulted to assist in monitoring and replacing electrolytes in this 63 y.o. male admitted on 04/28/2019 with Abdominal Pain   Labs:  Sodium (mmol/L)  Date Value  05/03/2019 135   Potassium (mmol/L)  Date Value  05/03/2019 3.7   Magnesium (mg/dL)  Date Value  05/03/2019 1.9   Phosphorus (mg/dL)  Date Value  05/03/2019 2.2 (L)   Calcium (mg/dL)  Date Value  05/03/2019 8.4 (L)   Albumin (g/dL)  Date Value  05/03/2019 2.9 (L)    Assessment/Plan: Patient is currently ordered dofetilide 513mcg BID. Pharmacy consulted for electrolyte replacement to maintain potassium at or above 4.   7/21 K 3.7,  Mag 1.9 , Phos 2.2 (low)  IVF NS/Potassium 59mEq at 45mL/hr D/C'ed   Will give K Phos neutral 2 tabs q4 hours x 2 and Kcl 31meq x 1  Will check BMP, phos, and mag with am labs.   Pharmacy will continue to monitor and adjust per consult.    Lu Duffel, PharmD, BCPS Clinical Pharmacist 05/03/2019 8:07 AM

## 2019-05-03 NOTE — Progress Notes (Signed)
UPDATE:  Pt requested drain placement again after discussion with cardiologists and additional family members.  Declining palliative care.   Dr. Posey Pronto and I confirmed again that he wishes for drain placement.  Understanding, the final decision to proceed with it is with the performing radiologist and his team availability. He and wife both verbalized understanding.  With the multiple miscommunications and last minute cancellations and requests, our physician-patient relationship has been strained to the point where proceeding with the interval cholecystectomy with myself afterwards will not be the best option.  I offered the patient and wife to refer them to a separate surgeon who maybe a better fit.  If no surgeon is available, I mentioned how the cholecystostomy tube maybe a long-term solution to prevent recurrent cholecystitis.  They both verbalized understanding and is still in agreement with proceeding with drain placement.  Further recs re: home meds, heparin drip, pacemaker, per primary and cardiology team.

## 2019-05-03 NOTE — Progress Notes (Signed)
Patient is refusing CBG finger sticks and insulin injections.   Fuller Mandril, RN

## 2019-05-04 LAB — BASIC METABOLIC PANEL
Anion gap: 9 (ref 5–15)
BUN: 11 mg/dL (ref 8–23)
CO2: 25 mmol/L (ref 22–32)
Calcium: 8.7 mg/dL — ABNORMAL LOW (ref 8.9–10.3)
Chloride: 103 mmol/L (ref 98–111)
Creatinine, Ser: 0.81 mg/dL (ref 0.61–1.24)
GFR calc Af Amer: >60 mL/min (ref >60)
GFR calc non Af Amer: >60 mL/min (ref >60)
Glucose, Bld: 275 mg/dL — ABNORMAL HIGH (ref 70–99)
Potassium: 3.6 mmol/L (ref 3.5–5.1)
Sodium: 137 mmol/L (ref 135–145)

## 2019-05-04 LAB — HEPATIC FUNCTION PANEL
ALT: 116 U/L — ABNORMAL HIGH (ref 0–44)
AST: 50 U/L — ABNORMAL HIGH (ref 15–41)
Albumin: 2.8 g/dL — ABNORMAL LOW (ref 3.5–5.0)
Alkaline Phosphatase: 394 U/L — ABNORMAL HIGH (ref 38–126)
Bilirubin, Direct: <0.1 mg/dL (ref 0.0–0.2)
Total Bilirubin: 0.5 mg/dL (ref 0.3–1.2)
Total Protein: 6.3 g/dL — ABNORMAL LOW (ref 6.5–8.1)

## 2019-05-04 LAB — CBC
HCT: 33.1 % — ABNORMAL LOW (ref 39.0–52.0)
Hemoglobin: 10.8 g/dL — ABNORMAL LOW (ref 13.0–17.0)
MCH: 27.6 pg (ref 26.0–34.0)
MCHC: 32.6 g/dL (ref 30.0–36.0)
MCV: 84.7 fL (ref 80.0–100.0)
Platelets: 276 10*3/uL (ref 150–400)
RBC: 3.91 MIL/uL — ABNORMAL LOW (ref 4.22–5.81)
RDW: 14.3 % (ref 11.5–15.5)
WBC: 10 10*3/uL (ref 4.0–10.5)
nRBC: 0 % (ref 0.0–0.2)

## 2019-05-04 LAB — GLUCOSE, CAPILLARY
Glucose-Capillary: 178 mg/dL — ABNORMAL HIGH (ref 70–99)
Glucose-Capillary: 297 mg/dL — ABNORMAL HIGH (ref 70–99)

## 2019-05-04 LAB — PROTIME-INR
INR: 1.4 — ABNORMAL HIGH (ref 0.8–1.2)
Prothrombin Time: 16.9 seconds — ABNORMAL HIGH (ref 11.4–15.2)

## 2019-05-04 LAB — MAGNESIUM: Magnesium: 1.8 mg/dL (ref 1.7–2.4)

## 2019-05-04 LAB — PHOSPHORUS: Phosphorus: 2.8 mg/dL (ref 2.5–4.6)

## 2019-05-04 LAB — HEPARIN LEVEL (UNFRACTIONATED): Heparin Unfractionated: 0.29 IU/mL — ABNORMAL LOW (ref 0.30–0.70)

## 2019-05-04 MED ORDER — ENOXAPARIN SODIUM 100 MG/ML ~~LOC~~ SOLN: 1.0000 mg/kg | Freq: Two times a day (BID) | SUBCUTANEOUS | 0 refills | Status: AC

## 2019-05-04 MED ORDER — WARFARIN SODIUM 7.5 MG PO TABS: 7.5000 mg | ORAL_TABLET | Freq: Once | ORAL | 0 refills | Status: AC

## 2019-05-04 MED ORDER — AMOXICILLIN-POT CLAVULANATE 875-125 MG PO TABS: 1.0000 | ORAL_TABLET | Freq: Two times a day (BID) | ORAL | 0 refills | Status: AC

## 2019-05-04 MED ORDER — AMOXICILLIN-POT CLAVULANATE 875-125 MG PO TABS: 1.0000 | ORAL_TABLET | Freq: Two times a day (BID) | ORAL | Status: DC

## 2019-05-04 MED ORDER — ENOXAPARIN SODIUM 100 MG/ML ~~LOC~~ SOLN
1.0000 mg/kg | Freq: Two times a day (BID) | SUBCUTANEOUS | Status: DC
  Administered 2019-05-04: 90 mg via SUBCUTANEOUS
  Filled 2019-05-04 (×2): qty 1

## 2019-05-04 MED ORDER — OXYCODONE-ACETAMINOPHEN 5-325 MG PO TABS: 1.0000 | ORAL_TABLET | ORAL | 0 refills | Status: AC | PRN

## 2019-05-04 MED ORDER — AMOXICILLIN-POT CLAVULANATE 875-125 MG PO TABS
1.0000 | ORAL_TABLET | Freq: Two times a day (BID) | ORAL | Status: DC
  Administered 2019-05-04: 1 via ORAL
  Filled 2019-05-04: qty 1

## 2019-05-04 MED ORDER — POTASSIUM CHLORIDE CRYS ER 20 MEQ PO TBCR
40.0000 meq | EXTENDED_RELEASE_TABLET | Freq: Once | ORAL | Status: DC
  Filled 2019-05-04: qty 2

## 2019-05-04 MED ORDER — ENOXAPARIN (LOVENOX) PATIENT EDUCATION KIT
PACK | Freq: Once | Status: AC
  Administered 2019-05-04: 13:00:00
  Filled 2019-05-04: qty 1

## 2019-05-04 MED ORDER — WARFARIN SODIUM 7.5 MG PO TABS
7.5000 mg | ORAL_TABLET | Freq: Once | ORAL | Status: DC
  Filled 2019-05-04: qty 1

## 2019-05-04 MED ORDER — MAGNESIUM SULFATE 2 GM/50ML IV SOLN
2.0000 g | Freq: Once | INTRAVENOUS | Status: DC
  Filled 2019-05-04: qty 50

## 2019-05-04 MED ORDER — LEVOFLOXACIN 500 MG PO TABS: 500.0000 mg | ORAL_TABLET | Freq: Every day | ORAL | Status: DC

## 2019-05-04 NOTE — Progress Notes (Addendum)
ANTICOAGULATION CONSULT NOTE - Initial Consult  Pharmacy Consult for Lovenox Bridge Indication: Transition back to Warfarin for A Fib with Mechanical Heart Valve  No Active Allergies  Patient Measurements: Height: 5\' 7"  (170.2 cm) Weight: 195 lb (88.5 kg) IBW/kg (Calculated) : 66.1 Heparin Dosing Weight:  84.5 kg   Vital Signs: Temp: 99.9 F (37.7 C) (07/22 0441) Temp Source: Oral (07/22 0441) BP: 174/79 (07/22 0441) Pulse Rate: 75 (07/22 0441)  Labs: Recent Labs    05/02/19 0507 05/03/19 0021 05/03/19 0207 05/03/19 1457 05/03/19 2214 05/04/19 0502 05/04/19 0509  HGB 11.9*  --   --  11.8*  --  10.8*  --   HCT 36.6*  --   --  36.1*  --  33.1*  --   PLT 235  --   --  283  --  276  --   APTT  --   --   --  45*  --   --   --   LABPROT 17.5*  --   --  16.0*  --  16.9*  --   INR 1.5*  --   --  1.3*  --  1.4*  --   HEPARINUNFRC 0.32  --  <0.10*  --  0.26*  --  0.29*  CREATININE 0.78 0.80  --   --   --  0.81  --   TROPONINIHS  --  74* 73*  --   --   --   --     Estimated Creatinine Clearance: 99.2 mL/min (by C-G formula based on SCr of 0.81 mg/dL).   Medical History: Past Medical History:  Diagnosis Date  . Carotid artery occlusion   . CHF (congestive heart failure) ([place])   . Diabetes mellitus without complication ([place])   . Hyperlipidemia   . Hypertension   . Stroke [facility])     Medications:  Scheduled:  . carvedilol  12.5 mg Oral BID WC  . dofetilide  500 mcg Oral BID  . finasteride  5 mg Oral Daily  . gabapentin  300 mg Oral BID  . magnesium oxide  200 mg Oral Daily  . pantoprazole  40 mg Oral Daily  . potassium chloride  40 mEq Oral Once  . sodium chloride flush  5 mL Intracatheter Q8H  . warfarin  7.5 mg Oral ONCE-1800  . Warfarin - Pharmacist Dosing Inpatient   Does not apply q1800   Infusions:  . heparin 1,600 Units/hr (05/04/19 0902)  . magnesium sulfate bolus IVPB    . piperacillin-tazobactam (ZOSYN)  IV 3.375 g (05/04/19 0515)     Assessment: Pharmacy consulted to transition patient to enoxaparin to complete bridge to therapeutic INR   -  Will give Warfarin 7.5mg  again this evening and 1 more evening (home dose 5mg )  INR Goal 2.5-3.5 (mechanical valve per Dr Fritzi Mandes)  Goal of Therapy:  Heparin level 0.3-0.7 units/ml Monitor platelets by anticoagulation protocol: Yes   Plan:  Stopping Heparin drip and changing to Lovenox bridge because pt to transfer today.  Will dose lovenox 1mg /kg q12 x 1 week with a planned INR follow-up outpatient - may ed/c lovenox earlier than 1 week in INR therapeutic  Consider following anti-Xa level for therapeutic monitoring of lovenox if pt fails to discharge  CBC/INR ordered with AM labs.   Lu Duffel, PharmD, BCPS Clinical Pharmacist 05/04/2019 9:29 AM

## 2019-05-04 NOTE — Progress Notes (Addendum)
Pt educated on plan for the day including CBG checks. Pt states "I don't get it checked because it doesn't work for me". Pt educated that while in the hospital we check all diabetic pt's for CBG's due to the delay in healing caused by high Blood sugar levels. Pt states "well I don't get them checked, it should be in the paperwork". Pt educated that the job of an Therapist, sports is to keep him safe and help him heal and CBG's are part of that process. Pt educated that he may refuse anything he likes, however, it is the RN's job to ask and educate. Pt did not respond to this. RN educated pt that she would be back to see him shortly. Pt again did not respond. Pt has call bell in reach and wife at bedside.   MD notified and aware. Gave VO to d/c CBG if pt refusing.

## 2019-05-04 NOTE — Discharge Summary (Signed)
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 Carotid artery occlusion   . CHF (congestive heart failure) ([place])   . Diabetes mellitus without complication ([place])   . Hyperlipidemia   . Hypertension   . Stroke [facility])     HOSPITAL COURSE:  63 year old male with past medical history of hypertension, hyperlipidemia, diabetes, CHF, history of previous CVA, carotid artery occlusion, hx of st. Jude's aortic valve who presented to the hospital due to abdominal pain and suspected to have acute cholecystitis.  1. Acute cholecystitis s/p GB drain + - Seen by general surgery and confirmed it by doing a HIDA scan. - Continue empiric antibiotics with Zosyn. -WBC 22K--10K - IR to place drain today. D/w Dr Kathlene Cote. -Long discussion was made with Michael Patrick and his wife in the room. Michael Patrick has changed his mind and now wants a GB drian placed . It was mentioned to him that the drain may stay permanent if we cannot have another surgeon to remove his gallbladder since he is at a very high risk for surgery given his medical co-morbidities He and his wife does understand the risk and benefits. The other option given to him is do nothing and let palliative care help out with goals of care which he wanted yesterday. -Status post gallbladder drain placement by IR on 05/03/2019 -patient's heparin drip was resumed along with overall Coumadin. -Patient wanted to go home and  requested Lovenox 1 mg per kilogram subcu BID with Coumadin. -His INR is 1.4 today. He will get 7.5 mg today and 7.5 mg tomorrow Coumadin. His levels will be followed by Dr. Ubaldo Glassing as outpatient. Dr. Ubaldo Glassing has been informed. -LTF's elevated -PO Augmentin for six more days -GB fluid -- WBCs present no organism seen  2. History of atrial fibrillation- rate controlled. -Continue carvedilol, continue Tikosyn.    3. Elevated LFTs secondary to cholecystitis  -I'm holding patient's status and still LFTs trend down. He will follow-up with Dr. Emily Filbert PCP for the same.  4.  History of St. Jude's aortic valve- patient is on Coumadin but coagulopathy needs to be reversed for surgery. - now on IV heparin gtt--holding today for procedure  5.  Hyperlipidemia- hold  Atorvastatin--LFT's up  6.  BPH- continue finasteride.   Patient will be set up with home health for PT/INR draw and education regarding gallbladder drain. Patient will follow-up with Dr. Emily Filbert PCP for referring him to surgery as outpatient for definite treatment on acute cholecystitis.  Patient prefers Lovenox with Coumadin so that he can go home. His INR will be managed by Dr. Ubaldo Glassing.  Above was discussed with patient and wife in the room. CONSULTS OBTAINED:  Treatment Team:  Benjamine Sprague, DO Teodoro Spray, MD  DRUG ALLERGIES:  No Active Allergies  DISCHARGE MEDICATIONS:   Allergies as of 05/04/2019   No Active Allergies     Medication List    STOP taking these medications   atorvastatin 10 MG tablet Commonly known as:  LIPITOR     TAKE these medications   amoxicillin-clavulanate 875-125 MG tablet Commonly known as: AUGMENTIN Take 1 tablet by mouth every 12 (twelve) hours for 6 days.   carvedilol 12.5 MG tablet Commonly known as: COREG Take 12.5 mg by mouth 2 (two) times daily with a meal.   Cinnamon Bark Powd by Does not apply route.   CoQ-10 100 MG Caps Take 100 mg by mouth daily.   diazepam 5 MG  tablet Commonly known as: VALIUM Take 5 mg by mouth 2 (two) times daily as needed.   docusate sodium 100 MG capsule Commonly known as: COLACE Take 100 mg by mouth daily.   enoxaparin 100 MG/ML injection Commonly known as: LOVENOX Inject 0.9 mLs (90 mg total) into the skin every 12 (twelve) hours for 2 days. Till INR is between 2.5-3.5   finasteride 5 MG tablet Commonly known as: PROSCAR Take 5 mg by mouth daily.   furosemide 40 MG tablet Commonly known as: LASIX Take 40 mg by mouth daily as needed.   gemfibrozil 600 MG tablet Commonly known as: LOPID Take 600 mg by mouth 2 (two) times daily before a meal.   glimepiride 4 MG tablet Commonly known as: AMARYL Take 4 mg by mouth 2 (two) times daily.   lisinopril 40 MG tablet Commonly known as: ZESTRIL Take 40 mg by mouth daily.   magnesium oxide 400 MG tablet Commonly known as: MAG-OX Take 200 mg by mouth daily.   Nitrostat 0.4 MG SL tablet Generic drug: nitroGLYCERIN PLACE 1 TABLET UNDER TONGUE EVERY 5 MIN AS NEEDED FOR CHEST PAIN IF NO RELIEF IN15 MIN CALL 911 (MAX 3 TABS)   ondansetron 4 MG tablet Commonly known as: ZOFRAN Take 4 mg by mouth every 8 (eight) hours as needed.   oxyCODONE-acetaminophen 5-325 MG tablet Commonly known as: PERCOCET/ROXICET Take 1-2 tablets by mouth every 4 (four) hours as needed for moderate pain. What changed:   how much to take  when to take this  reasons to take this   pantoprazole 40 MG tablet Commonly known as: PROTONIX Take 40 mg by mouth daily.   psyllium 58.6 % packet Commonly known as: METAMUCIL Take 1 packet by mouth daily.   Tikosyn 500 MCG capsule Generic drug: dofetilide Take 500 mcg by mouth 2 (two) times daily.   Vitamin B-12 5000 MCG Subl Place under the tongue.   warfarin 7.5 MG tablet Commonly known as: COUMADIN Take 1 tablet (7.5 mg total) by mouth one time only at 6 PM. Take 7.5 mg today and tomorrow and thereafter follow instructions from Dr Bethanne Ginger  office What changed:   medication strength  how much to take  when to take this  additional instructions       If you experience worsening of your admission symptoms, develop shortness of breath, life threatening emergency, suicidal or homicidal thoughts you must seek medical attention immediately by calling 911 or calling your MD immediately  if symptoms less severe.  You Must read complete instructions/literature along with all the possible adverse reactions/side effects for all the Medicines you take and that have been prescribed to you. Take any new Medicines after you have completely understood and accept all the possible adverse reactions/side effects.   Please note  You were cared for by a hospitalist during your hospital stay. If you have any questions about your discharge medications or the care you received while you were in the hospital after you are discharged, you can call the unit  and asked to speak with the hospitalist on call if the hospitalist that took care of you is not available. Once you are discharged, your primary care physician will handle any further medical issues. Please note that NO REFILLS for any discharge medications will be authorized once you are discharged, as it is imperative that you return to your primary care physician (or establish a relationship with a primary care physician if you do not have one) for your aftercare needs so that they can reassess your need for medications and monitor your lab values. Today   SUBJECTIVE   I would like to go home if you can change to Lovenox with Coumadin  VITAL SIGNS:  Blood pressure (!) 174/79, pulse 75, temperature 99.9 F (37.7 C), temperature source Oral, resp. rate 20, height 5\' 7"  (1.702 m), weight 88.5 kg, SpO2 96 %.  I/O:    Intake/Output Summary (Last 24 hours) at 05/04/2019 1200 Last data filed at 05/04/2019 0900 Gross per 24 hour  Intake 326.18 ml  Output 50 ml  Net 276.18 ml    PHYSICAL  EXAMINATION:  GENERAL:  63 y.o.-year-old patient lying in the bed with no acute distress.  EYES: Pupils equal, round, reactive to light and accommodation. No scleral icterus. Extraocular muscles intact.  HEENT: Head atraumatic, normocephalic. Oropharynx and nasopharynx clear.  NECK:  Supple, no jugular venous distention. No thyroid enlargement, no tenderness.  LUNGS: Normal breath sounds bilaterally, no wheezing, rales,rhonchi or crepitation. No use of accessory muscles of respiration.  CARDIOVASCULAR: S1, S2 normal. No murmurs, rubs, or gallops.  ABDOMEN: Soft, non-tender, non-distended. Bowel sounds present. No organomegaly or mass. GB drain --dark bloody looking lfuid EXTREMITIES: No pedal edema, cyanosis, or clubbing.  NEUROLOGIC: Cranial nerves II through XII are intact. Muscle strength 5/5 in all extremities. Sensation intact. Gait not checked.  PSYCHIATRIC: The patient is alert and oriented x 3.  SKIN: No obvious rash, lesion, or ulcer.   DATA REVIEW:   CBC  Recent Labs  Lab 05/04/19 0502  WBC 10.0  HGB 10.8*  HCT 33.1*  PLT 276    Chemistries  Recent Labs  Lab 05/04/19 0502  NA 137  K 3.6  CL 103  CO2 25  GLUCOSE 275*  BUN 11  CREATININE 0.81  CALCIUM 8.7*  MG 1.8  AST 50*  ALT 116*  ALKPHOS 394*  BILITOT 0.5    Microbiology Results   Recent Results (from the past 240 hour(s))  Microscopic Examination     Status: Abnormal   Collection Time: 04/28/19 11:09 AM   URINE  Result Value Ref Range Status   WBC, UA 0-5 0 - 5 /hpf Final   RBC >30 (A) 0 - 2 /hpf Final   Epithelial Cells (non renal) 0-10 0 - 10 /hpf Final   Casts Present (A) None seen /lpf Final   Cast Type Granular casts (A) N/A Final   Mucus, UA Present (A) Not Estab. Final   Bacteria, UA Few None seen/Few Final  Blood culture (routine x 2)     Status: None   Collection Time: 04/28/19 12:36 PM   Specimen: BLOOD  Result Value Ref Range Status   Specimen Description BLOOD LEFT ANTECUBITAL   Final   Special Requests   Final    BOTTLES DRAWN AEROBIC AND ANAEROBIC Blood Culture results may not be optimal due to an excessive volume of blood received in culture bottles   Culture   Final    NO GROWTH 5 DAYS Performed at  [facility], [address], [place], [place] [address]    Report Status 05/03/2019 FINAL  Final  Blood culture (routine x 2)     Status: None   Collection Time: 04/28/19 12:37 PM   Specimen: BLOOD  Result Value Ref Range Status   Specimen Description BLOOD RIGHT ANTECUBITAL  Final   Special Requests   Final    BOTTLES DRAWN AEROBIC AND ANAEROBIC Blood Culture results may not be optimal due to an excessive volume of blood received in culture bottles   Culture   Final    NO GROWTH 5 DAYS Performed at [facility], [address], [place], [place] [address]    Report Status 05/03/2019 FINAL  Final  SARS Coronavirus 2 (CEPHEID - Performed in [place] hospital lab), Hosp Order     Status: None   Collection Time: 04/28/19  2:04 PM   Specimen: Nasopharyngeal Swab  Result Value Ref Range Status   SARS Coronavirus 2 NEGATIVE NEGATIVE Final    Comment: (NOTE) If result is NEGATIVE SARS-CoV-2 target nucleic acids are NOT DETECTED. The SARS-CoV-2 RNA is generally detectable in upper and lower  respiratory specimens during the acute phase of infection. The lowest  concentration of SARS-CoV-2 viral copies this assay can detect is 250  copies / mL. A negative result does not preclude SARS-CoV-2 infection  and should not be used as the sole basis for treatment or other  patient management decisions.  A negative result may occur with  improper specimen collection / handling, submission of specimen other  than nasopharyngeal swab, presence of viral mutation(s) within the  areas targeted by this assay, and inadequate number of viral copies  (<250 copies / mL). A negative result must be combined with clinical  observations, patient history, and  epidemiological information. If result is POSITIVE SARS-CoV-2 target nucleic acids are DETECTED. The SARS-CoV-2 RNA is generally detectable in upper and lower  respiratory specimens dur ing the acute phase of infection.  Positive  results are indicative of active infection with SARS-CoV-2.  Clinical  correlation with patient history and other diagnostic information is  necessary to determine patient infection status.  Positive results do  not rule out bacterial infection or co-infection with other viruses. If result is PRESUMPTIVE POSTIVE SARS-CoV-2 nucleic acids MAY BE PRESENT.   A presumptive positive result was obtained on the submitted specimen  and confirmed on repeat testing.  While 2019 novel coronavirus  (SARS-CoV-2) nucleic acids may be present in the submitted sample  additional confirmatory testing may be necessary for epidemiological  and / or clinical management purposes  to differentiate between  SARS-CoV-2 and other Sarbecovirus currently known to infect humans.  If clinically indicated additional testing with an alternate test  methodology [id number]) is advised. The SARS-CoV-2 RNA is generally  detectable in upper and lower respiratory sp ecimens during the acute  phase of infection. The expected result is Negative. Fact Sheet for Patients:  StrictlyIdeas.no Fact Sheet for Healthcare Providers: BankingDealers.co.za This test is not yet approved or cleared by the [place] FDA and has been authorized for detection and/or diagnosis of SARS-CoV-2 by FDA under an Emergency Use Authorization (EUA).  This EUA will remain in effect (meaning this test can be used) for the duration of the COVID-19 declaration under Section 564(b)(1) of the Act, 21 U.S.C. section 360bbb-3(b)(1), unless the authorization is terminated or revoked sooner. Performed at [facility], [address], [place], [place] [address]    Aerobic/Anaerobic Culture (surgical/deep  wound)     Status: None (Preliminary result)   Collection Time: 05/03/19 12:39 PM   Specimen: Abscess  Result Value Ref Range Status   Specimen Description   Final    ABSCESS Performed at [facility], [address] [place], [place] [address]    Special Requests GALL BLADDER  Final   Gram Stain   Final    ABUNDANT WBC PRESENT, PREDOMINANTLY PMN NO ORGANISMS SEEN    Culture   Final    NO GROWTH < 24 HOURS Performed at [facility], [place] [address] [place], [place] [address]    Report Status PENDING  Incomplete    RADIOLOGY:  Dg Chest 1 View  Result Date: 05/03/2019 CLINICAL DATA:  Shortness of breath EXAM: CHEST  1 VIEW COMPARISON:  None. FINDINGS: There is a leftchest wall 4 lead AICD. The heart size is mildly enlarged. Both lungs are clear. The visualized skeletal structures are unremarkable. IMPRESSION: No active disease. Electronically Signed   By: Ulyses Jarred M.D.   On: 05/03/2019 01:06   Ct Image Guided Drainage By Percutaneous Catheter  Result Date: 05/03/2019 CLINICAL DATA:  Cholecystitis and need for percutaneous cholecystostomy tube. EXAM: CT GUIDED PLACEMENT OF PERCUTANEOUS CHOLECYSTOSTOMY TUBE ANESTHESIA/SEDATION: 1.0 mg IV Versed 100 mcg IV Fentanyl Total Moderate Sedation Time:  20 minutes The patient's level of consciousness and physiologic status were continuously monitored during the procedure by Radiology nursing. PROCEDURE: The procedure, risks, benefits, and alternatives were explained to the patient. Questions regarding the procedure were encouraged and answered. The patient understands and consents to the procedure. A time out was performed prior to initiating the procedure. CT was performed through the upper and mid abdomen in a supine position. The right abdominal wall was prepped with chlorhexidine in a sterile fashion, and a sterile drape was applied covering the operative field. A sterile gown and  sterile gloves were used for the procedure. Local anesthesia was provided with 1% Lidocaine. Under CT guidance, an 18 gauge trocar needle was advanced into the gallbladder lumen. After confirming needle tip position, a guidewire was advanced. The percutaneous tract was dilated and a 10 French drainage catheter advanced. Catheter position was confirmed by CT. The catheter was further advanced into the gallbladder lumen. An aspirated fluid sample was sent for culture analysis. The catheter was flushed with saline and connected to a gravity drainage bag. It was secured at the skin with a Prolene retention suture and StatLock device. COMPLICATIONS: None FINDINGS: Initial unenhanced imaging shows change in appearance of the gallbladder since the CTA of 04/28/2019 with high density material seen throughout the gallbladder lumen. After needle placement, aspiration yielded blood and clot consistent with hemorrhagic fluid. After 10 French cholecystostomy tube placement, there is return of grossly hemorrhagic fluid. IMPRESSION: CT-guided placement of percutaneous cholecystostomy tube with return of grossly hemorrhagic fluid. A fluid sample was sent for culture analysis. The catheter was connected to a gravity drainage bag and will be flushed 3 times daily to try to irrigate the hemorrhagic fluid which is currently draining very slowly. Electronically Signed   By: Aletta Edouard M.D.   On: 05/03/2019 14:08     CODE STATUS:     Code Status Orders  (From admission, onward)         Start     Ordered   05/02/19 1306  Do not attempt resuscitation (DNR)  Continuous    Question Answer Comment  In the event of cardiac or respiratory ARREST Do not  call a "code blue"   In the event of cardiac or respiratory ARREST Do not perform Intubation, CPR, defibrillation or ACLS   In the event of cardiac or respiratory ARREST Use medication by any route, position, wound care, and other measures to relive pain and suffering. May  use oxygen, suction and manual treatment of airway obstruction as needed for comfort.      05/02/19 1305        Code Status History    Date Active Date Inactive Code Status Order ID Comments User Context   05/02/2019 1248 05/02/2019 1305 DNR [id number]  Haynes Dage, RN Inpatient   04/28/2019 1739 05/02/2019 1248 Full Code [id number]  Saundra Shelling, MD ED   05/10/2018 1843 05/12/2018 1520 DNR [id number]  Gladstone Lighter, MD Inpatient   Advance Care Planning Activity    Advance Directive Documentation     Most Recent Value  Type of Advance Directive  Healthcare Power of Attorney, Living will  Pre-existing out of facility DNR order (yellow form or pink MOST form)  -  "MOST" Form in Place?  -      TOTAL TIME TAKING CARE OF THIS PATIENT: 40 minutes.    Fritzi Mandes M.D on 05/04/2019 at 12:00 PM  Between 7am to 6pm - Pager - [phone] After 6pm go to [email or web address] - password [email or web address]  Office  [phone]  CC: Primary care physician; Rusty Aus, MD

## 2019-05-04 NOTE — TOC Transition Note (Signed)
Transition of Care [facility]) - CM/SW Discharge Note   Patient Details  Name: Michael Patrick MRN: [id number] Date of Birth: 01-28-1956  Transition of [facility]) CM/SW Contact:  Beverly Sessions, RN Phone Number: 05/04/2019, 4:49 PM   Clinical Narrative:    Patient admitted with acute cholecystitis  Patient status post drain placement   Wife at bedside.  Bedside Rn to provide drain education prior to discharge  Patient lives at home with wife.   PCP [place]. [facility] in [place]  Wife provides transportation  Patient has Rw x2, cane, WC, in the home.   Patient to have home health for INR.  Patient and wife agreeable.  State they do not have a preference of agency.  Referral made and accepted to [facility].  Start of care for tomorrow.    Final next level of care: Home w Home Health Services Barriers to Discharge: Barriers Resolved   Patient Goals and CMS Choice   CMS Medicare.gov Compare Post Acute Care list provided to:: Patient Choice offered to / list presented to : Patient  Discharge Placement                  Name of family member notified: wife Patient and family notified of of transfer: 05/04/19  Discharge Plan and Services                          HH Arranged: RN [facility] Agency: [place]: [phone] Representative spoke with at [place]: [place] ([place]) Interventions     Readmission Risk Interventions Readmission Risk Prevention Plan 05/01/2019  Transportation Screening Complete  PCP or Specialist Appt within 3-5 Days Complete  HRI or [place] Complete  Social Work Consult for [place] Planning/Counseling Complete  Palliative Care Screening Not Applicable  Medication Review Press photographer) Complete  Some recent data might be hidden

## 2019-05-04 NOTE — Progress Notes (Signed)
Nsg Discharge Note  Admit Date:  04/28/2019 Discharge date: 05/04/2019   [name] to be D/C'd Home per MD order.  AVS completed.  Copy for chart, and copy for patient signed, and dated. Patient/caregiver able to verbalize understanding.  Discharge Medication: Allergies as of 05/04/2019   No Active Allergies     Medication List    STOP taking these medications   atorvastatin 10 MG tablet Commonly known as: LIPITOR     TAKE these medications   amoxicillin-clavulanate 875-125 MG tablet Commonly known as: AUGMENTIN Take 1 tablet by mouth every 12 (twelve) hours for 6 days.   carvedilol 12.5 MG tablet Commonly known as: COREG Take 12.5 mg by mouth 2 (two) times daily with a meal.   Cinnamon Bark Powd by Does not apply route.   CoQ-10 100 MG Caps Take 100 mg by mouth daily.   diazepam 5 MG tablet Commonly known as: VALIUM Take 5 mg by mouth 2 (two) times daily as needed.   docusate sodium 100 MG capsule Commonly known as: COLACE Take 100 mg by mouth daily.   enoxaparin 100 MG/ML injection Commonly known as: LOVENOX Inject 0.9 mLs (90 mg total) into the skin every 12 (twelve) hours for 2 days. Till INR is between 2.5-3.5   finasteride 5 MG tablet Commonly known as: PROSCAR Take 5 mg by mouth daily.   furosemide 40 MG tablet Commonly known as: LASIX Take 40 mg by mouth daily as needed.   gemfibrozil 600 MG tablet Commonly known as: LOPID Take 600 mg by mouth 2 (two) times daily before a meal.   glimepiride 4 MG tablet Commonly known as: AMARYL Take 4 mg by mouth 2 (two) times daily.   lisinopril 40 MG tablet Commonly known as: ZESTRIL Take 40 mg by mouth daily.   magnesium oxide 400 MG tablet Commonly known as: MAG-OX Take 200 mg by mouth daily.   Nitrostat 0.4 MG SL tablet Generic drug: nitroGLYCERIN PLACE 1 TABLET UNDER TONGUE EVERY 5 MIN AS NEEDED FOR CHEST PAIN IF NO RELIEF IN15 MIN CALL 911 (MAX 3 TABS)   ondansetron 4 MG tablet Commonly  known as: ZOFRAN Take 4 mg by mouth every 8 (eight) hours as needed.   oxyCODONE-acetaminophen 5-325 MG tablet Commonly known as: PERCOCET/ROXICET Take 1-2 tablets by mouth every 4 (four) hours as needed for moderate pain. What changed:   how much to take  when to take this  reasons to take this   pantoprazole 40 MG tablet Commonly known as: PROTONIX Take 40 mg by mouth daily.   psyllium 58.6 % packet Commonly known as: METAMUCIL Take 1 packet by mouth daily.   Tikosyn 500 MCG capsule Generic drug: dofetilide Take 500 mcg by mouth 2 (two) times daily.   Vitamin B-12 5000 MCG Subl Place under the tongue.   warfarin 7.5 MG tablet Commonly known as: COUMADIN Take 1 tablet (7.5 mg total) by mouth one time only at 6 PM. Take 7.5 mg today and tomorrow and thereafter follow instructions from Dr Bethanne Ginger office What changed:   medication strength  how much to take  when to take this  additional instructions       Discharge Assessment: Vitals:   05/03/19 2210 05/04/19 0441  BP: (!) 175/74 (!) 174/79  Pulse: 69 75  Resp: 20   Temp: (!) 100.7 F (38.2 C) 99.9 F (37.7 C)  SpO2: 97% 96%   Skin clean, dry and intact without evidence of skin break down, no  evidence of skin tears noted. IV catheter discontinued intact. Site without signs and symptoms of complications - no redness or edema noted at insertion site, patient denies c/o pain - only slight tenderness at site.  Dressing with slight pressure applied.  D/c Instructions-Education: Discharge instructions given to patient/family with verbalized understanding. D/c education completed with patient/family including follow up instructions, medication list, d/c activities limitations if indicated, with other d/c instructions as indicated by MD - patient able to verbalize understanding, all questions fully answered. Patient instructed to return to ED, call 911, or call MD for any changes in condition.  Patient escorted via  [place], and D/C home via private auto.  Eda Keys, RN 05/04/2019 1:07 PM

## 2019-05-04 NOTE — Progress Notes (Addendum)
Pharmacy Electrolyte Monitoring Consult:  Pharmacy consulted to assist in monitoring and replacing electrolytes in this 63 y.o. male admitted on 04/28/2019 with Abdominal Pain   Labs:  Sodium (mmol/L)  Date Value  05/04/2019 137   Potassium (mmol/L)  Date Value  05/04/2019 3.6   Magnesium (mg/dL)  Date Value  05/04/2019 1.8   Phosphorus (mg/dL)  Date Value  05/04/2019 2.8   Calcium (mg/dL)  Date Value  05/04/2019 8.7 (L)   Albumin (g/dL)  Date Value  05/04/2019 2.8 (L)    Assessment/Plan: Patient is currently ordered dofetilide 5103mcg BID. Pharmacy consulted for electrolyte replacement to maintain potassium at or above 4.   7/21  K 3.7 Mag 1.9 Phos 2.2  7/22  K 3.6 Mag 1.8 Phos 2.8  Patient is receiving MagOx 200mg  daily  Will give Kcl 38meq x 1, Mag 2g IV x 1  Will check K, Phos, and Mag with am labs.   Pharmacy will continue to monitor and adjust per consult.    Lu Duffel, PharmD, BCPS Clinical Pharmacist 05/04/2019 7:21 AM

## 2019-05-04 NOTE — Progress Notes (Addendum)
Upon entering the room pt sitting up in bed, smiling and talking to his wife. Pt appears in NAD. Pt reports pain 8/10 this am. Pt states that he has pain in his belly and back. Pt also states that he takes Percocets at home for pain. Pt educated on pain control and plan for discharge, including PO pain control. Pt educated that he needs to try to stay away from IV pain medication due to the fact that there will be none at home. Pt verbalizes understanding. Educated pt that this RN will look and see what is available.   MD Notified and aware.

## 2019-05-04 NOTE — Progress Notes (Addendum)
ANTICOAGULATION CONSULT NOTE - Initial Consult  Pharmacy Consult for Heparin Drip Indication: Transition back to Warfarin for A Fib  No Active Allergies  Patient Measurements: Height: 5\' 7"  (170.2 cm) Weight: 195 lb (88.5 kg) IBW/kg (Calculated) : 66.1 Heparin Dosing Weight:  84.5 kg   Vital Signs: Temp: 99.9 F (37.7 C) (07/22 0441) Temp Source: Oral (07/22 0441) BP: 174/79 (07/22 0441) Pulse Rate: 75 (07/22 0441)  Labs: Recent Labs    05/02/19 0507 05/03/19 0021 05/03/19 0207 05/03/19 1457 05/03/19 2214 05/04/19 0502 05/04/19 0509  HGB 11.9*  --   --  11.8*  --  10.8*  --   HCT 36.6*  --   --  36.1*  --  33.1*  --   PLT 235  --   --  283  --  276  --   APTT  --   --   --  45*  --   --   --   LABPROT 17.5*  --   --  16.0*  --  16.9*  --   INR 1.5*  --   --  1.3*  --  1.4*  --   HEPARINUNFRC 0.32  --  <0.10*  --  0.26*  --  0.29*  CREATININE 0.78 0.80  --   --   --  0.81  --   TROPONINIHS  --  74* 73*  --   --   --   --     Estimated Creatinine Clearance: 99.2 mL/min (by C-G formula based on SCr of 0.81 mg/dL).   Medical History: Past Medical History:  Diagnosis Date  . Carotid artery occlusion   . CHF (congestive heart failure) ([place])   . Diabetes mellitus without complication ([place])   . Hyperlipidemia   . Hypertension   . Stroke [facility])     Medications:  Scheduled:  . carvedilol  12.5 mg Oral BID WC  . dofetilide  500 mcg Oral BID  . finasteride  5 mg Oral Daily  . gabapentin  300 mg Oral BID  . insulin aspart  0-15 Units Subcutaneous TID WC  . insulin aspart  0-5 Units Subcutaneous QHS  . magnesium oxide  200 mg Oral Daily  . pantoprazole  40 mg Oral Daily  . sodium chloride flush  5 mL Intracatheter Q8H  . Warfarin - Pharmacist Dosing Inpatient   Does not apply q1800   Infusions:  . heparin 1,500 Units/hr (05/03/19 2338)  . piperacillin-tazobactam (ZOSYN)  IV 3.375 g (05/04/19 0515)    Assessment: Pharmacy consulted for heparin drip management  for 63 yo male admitted with acute cholecystitis and planned surgical intervention early next week. Patient takes warfarin due to atrial fibrillation as an outpatient. INR is 1.7 today and patient was been initiated on heparin infusion at 1150 units/hr.  7/18 -  Drip initiated 1150 units/hr 7/18 1221 HL 0.14 - 2500 unit bolus, Rate inc to 1350 units/hr 7/18 2023 HL 0.42 - Continue 1350 units/hr 7/21 @  2214 HL: 0.26. Level is subtherapeutic. Infusion increased to 1500 units/hr  7/21 1457 INR 1.3 7/22 0502 INR 1.4   -  Will give Warfarin 7.5mg  again this evening (home dose 5mg )  Goal of Therapy:  Heparin level 0.3-0.7 units/ml Monitor platelets by anticoagulation protocol: Yes   Plan:  7/22 @ 0509 HL: 0.29. Level is slightly subtherapeutic. Level was drawn early.    Will  increase heparin infusion to 1600 unit/hr.  Recheck HL  6 hours after infusion rate change.  CBC ordered with AM labs.   Pernell Dupre, PharmD, BCPS Clinical Pharmacist 05/04/2019 6:49 AM   Lu Duffel, PharmD, BCPS Clinical Pharmacist 05/04/2019 7:18 AM

## 2019-05-04 NOTE — Discharge Instructions (Signed)
Cholecystitis ° °Cholecystitis is irritation and swelling (inflammation) of the gallbladder. The gallbladder is an organ that is shaped like a pear. It is under the liver on the right side of the body. This organ stores bile. Bile helps the body break down (digest) the fats in food. °This condition can occur all of a sudden. It needs to be treated. °What are the causes? °This condition may be caused by stones or lumps that form in the gallbladder (gallstones). Gallstones can block the tube (duct) that carries bile out of your gallbladder. °Other causes are: °· Damage to the gallbladder due to less blood flow. °· Germs in the bile ducts. °· Scars or kinks in the bile ducts. °· Abnormal growths (tumors) in the liver, pancreas, or gallbladder. °What increases the risk? °You are more likely to develop this condition if: °· You have sickle cell disease. °· You take birth control pills.  °· You use estrogen. °· You have alcoholic liver disease. °· You have liver cirrhosis. °· You are being fed through a vein. °· You are very ill. °· You do not eat or drink for a long time. This is also called "fasting." °· You are overweight (obese). °· You lose weight too fast. °· You are pregnant. °· You have high levels of fat in the blood (triglycerides). °· You have irritation and swelling of the pancreas (pancreatitis). °What are the signs or symptoms? °Symptoms of this condition include: °· Pain in the belly (abdomen). Pain is often in the upper right area of the belly. °· Tenderness or bloating in the belly. °· Feeling sick to your stomach (nauseous). °· Throwing up (vomiting). °· Fever. °· Chills. °How is this diagnosed? °This condition may be diagnosed with a medical history and exam. You may also have other tests, such as: °· Imaging tests. This may include: °? Ultrasound. °? CT scan of the belly. °? Nuclear scan. This is also called a HIDA scan. This scan lets your doctor see the bile as it moves in your body. °? MRI. °· Blood  tests. These are done to check: °? Your blood count. The white blood cell count may be higher than normal. °? How well your liver works. °How is this treated? °This condition may be treated with: °· Surgery to take out your gallbladder. °· Antibiotic medicines to treat illnesses caused by germs. °· Going without food for some time. °· Giving fluids through an IV tube. °· Medicines to treat pain or throwing up. °Follow these instructions at home: °· If you had surgery, follow instructions from your doctor about how to care for yourself after you go home. °Medicines ° °· Take over-the-counter and prescription medicines only as told by your doctor. °· If you were prescribed an antibiotic medicine, take it as told by your doctor. Do not stop taking it even if you start to feel better. °General instructions °· Follow instructions from your doctor about what to eat or drink. Do not eat or drink anything that makes you sick again. °· Do not lift anything that is heavier than 10 lb (4.5 kg) until your doctor says that it is safe. °· Do not use any products that contain nicotine or tobacco, such as cigarettes and e-cigarettes. If you need help quitting, ask your doctor. °· Keep all follow-up visits as told by your doctor. This is important. °Contact a doctor if: °· You have pain and your medicine does not help. °· You have a fever. °Get help right away if: °·   Your pain moves to: ? Another part of your belly. ? Your back.  Your symptoms do not go away.  You have new symptoms. Summary  Cholecystitis is swelling and irritation of the gallbladder.  This condition may be caused by stones or lumps that form in the gallbladder (gallstones).  Common symptoms are pain in the belly. You may feel sick to your stomach and start throwing up. You may also have a fever and chills.  This condition may be treated with surgery to take out the gallbladder. It may also be treated with medicines, fasting, and fluids through an IV  tube.  Follow what you are told about eating and drinking. Do not eat things that make you sick again. This information is not intended to replace advice given to you by your health care provider. Make sure you discuss any questions you have with your health care provider. Document Released: 09/18/2011 Document Revised: 02/05/2018 Document Reviewed: 02/05/2018 Elsevier Patient Education  2020 [employer]  

## 2019-05-04 NOTE — Progress Notes (Addendum)
Pt and wife at bedside. Pt refusing all "over the counter supplements".  MD notified of same and of pt's request to speak with her.   Md states she has to see a few pts and will be back to talk with pt. Pt informed of same.

## 2019-05-04 NOTE — Progress Notes (Signed)
Pt and pt's wife educated on discharge instructions. Pt's wife states that she is a Psychologist, sport and exercise and able to return demonstrate how to give lovenox in the belly. Able to demonstrate how to flush and empty the biliary drain. Provided with clean cup for measuring, lovenox d/c kit, and flushes for biliary drain. Verbalizes understanding.

## 2019-05-04 NOTE — Progress Notes (Signed)
Pt standing in hallway, no shirt, wearing a mask, with pt's wife standing at door of pt's room. Pt stated "I'm waiting for my doctor". Pt educated that MD had to see another pt and will be back to speak with him as soon as she can. Pt did not respond and continues to stand in hallway.

## 2019-05-08 LAB — AEROBIC/ANAEROBIC CULTURE W GRAM STAIN (SURGICAL/DEEP WOUND)

## 2019-07-14 DEATH — deceased

## 2019-08-14 DEATH — deceased

## 2019-12-12 DEATH — deceased

## 2019-12-16 ENCOUNTER — Ambulatory Visit: Payer: Self-pay | Admitting: Urology

## 2020-05-21 IMAGING — CT CT IMAGE GUIDED DRAINAGE BY PERCUTANEOUS CATHETER
1 of 4 series · 13 of 32 positions shown, 18 images · non-contrast
Comparison: none

CLINICAL DATA: Cholecystitis and need for percutaneous
cholecystostomy tube.

[Series 2: i-spiral 5.0 b30f · axial · 0.76mm/px · z∈[+1062,+1202]mm · 13 of 48 slices shown, 18 images]
[im 4/48  soft-tissue]
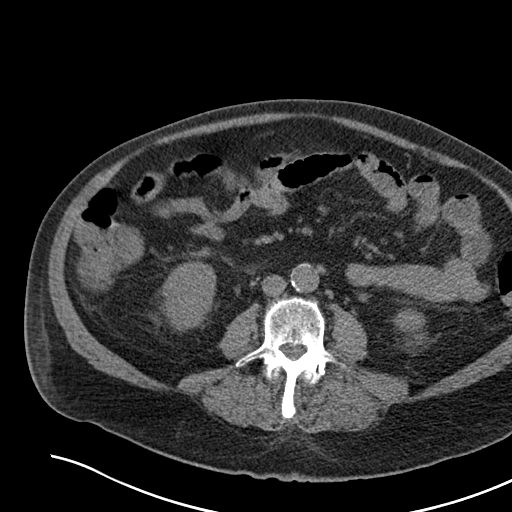
[im 4/48  bone]
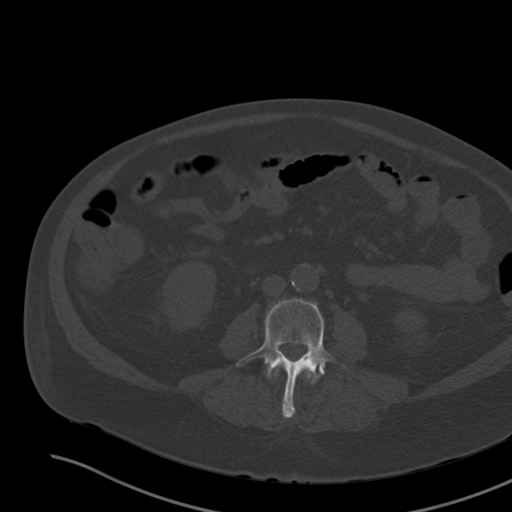
[im 7/48  soft-tissue]
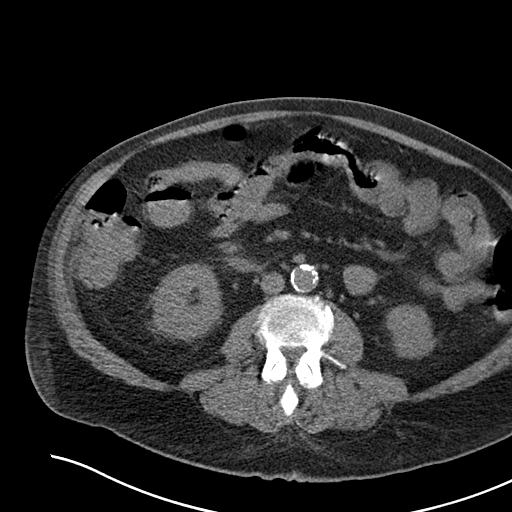
[im 10/48  soft-tissue]
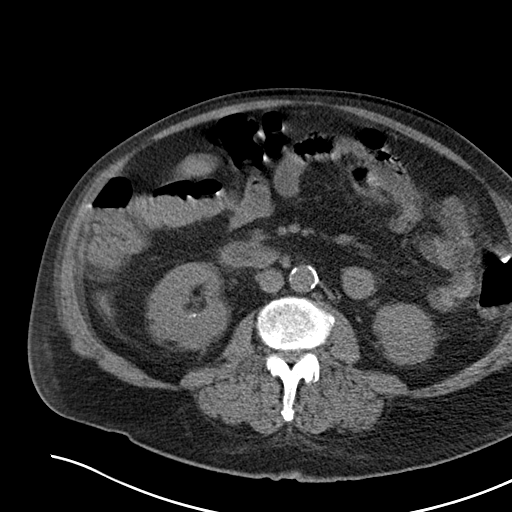
[im 16/48  soft-tissue]
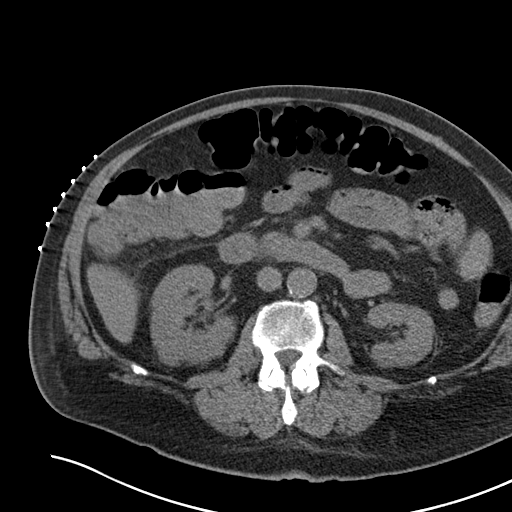
[im 19/48  soft-tissue]
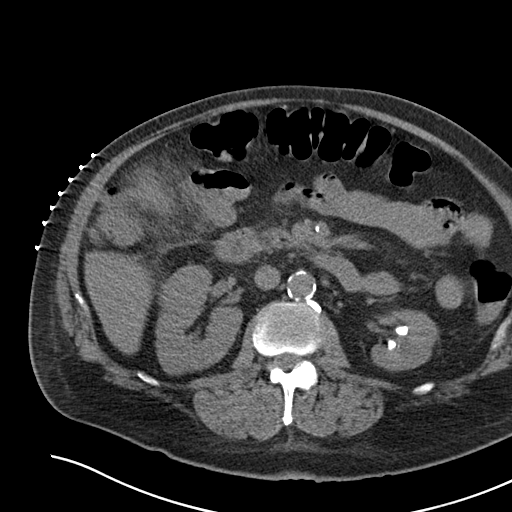
[im 22/48  soft-tissue]
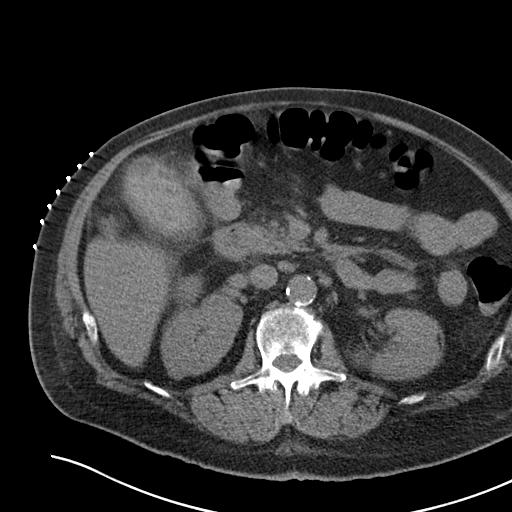
[im 26/48  soft-tissue]
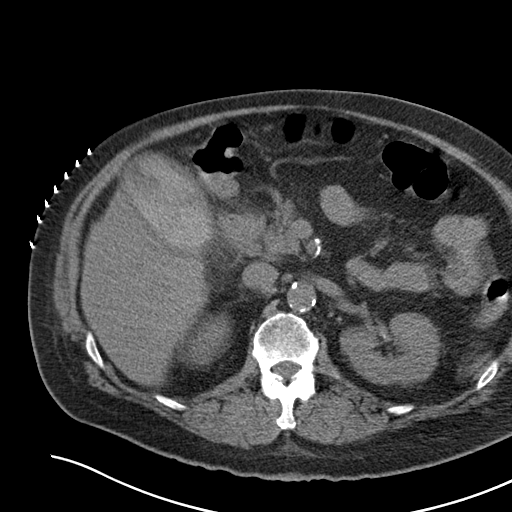
[im 29/48  soft-tissue]
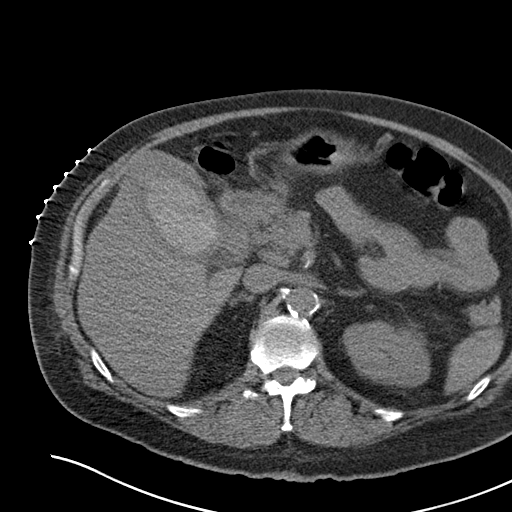
[im 32/48  soft-tissue]
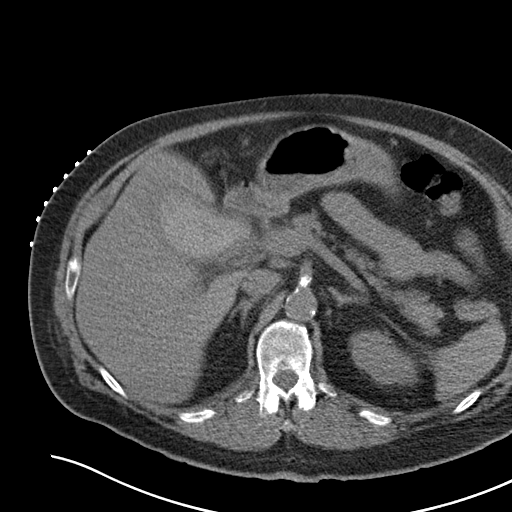
[im 32/48  bone]
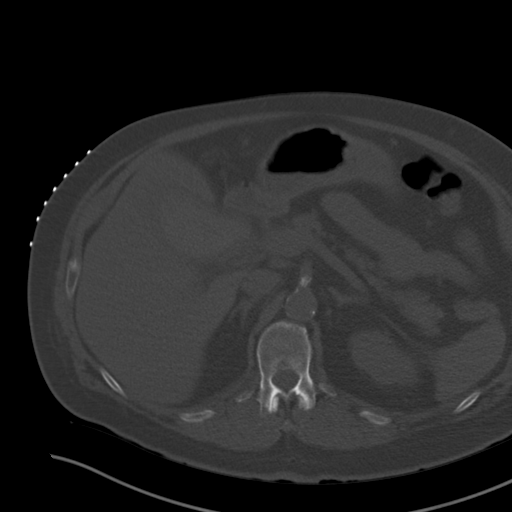
[im 35/48  lung]
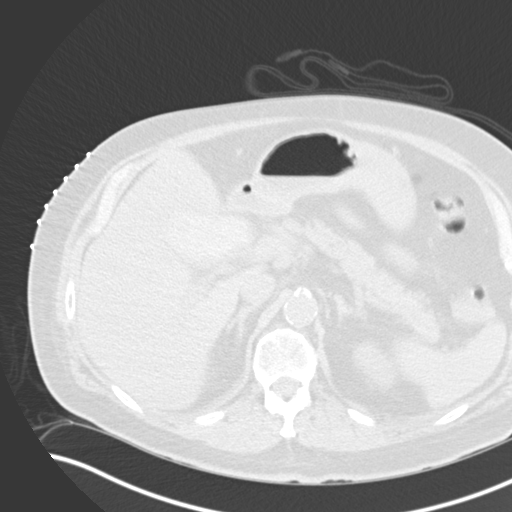
[im 38/48  soft-tissue]
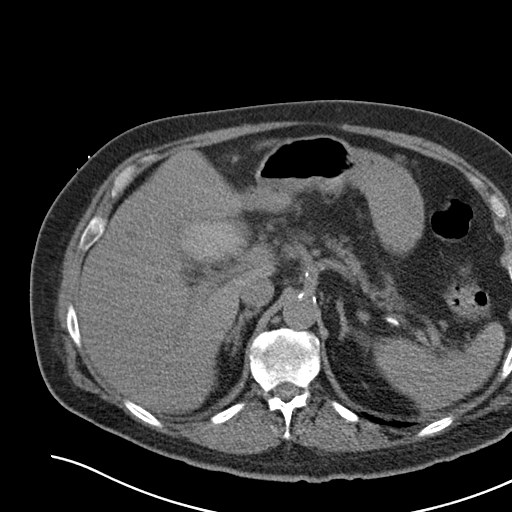
[im 38/48  lung]
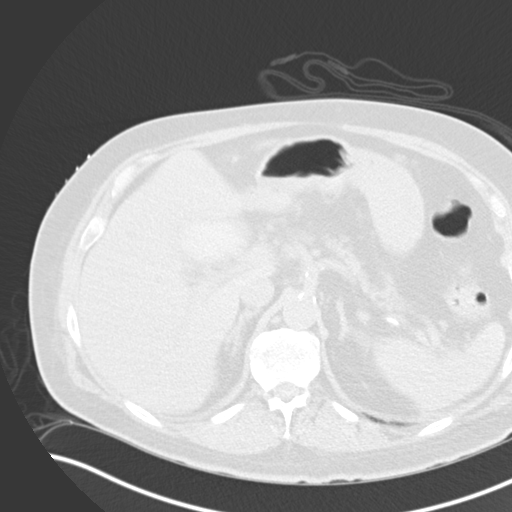
[im 41/48  soft-tissue]
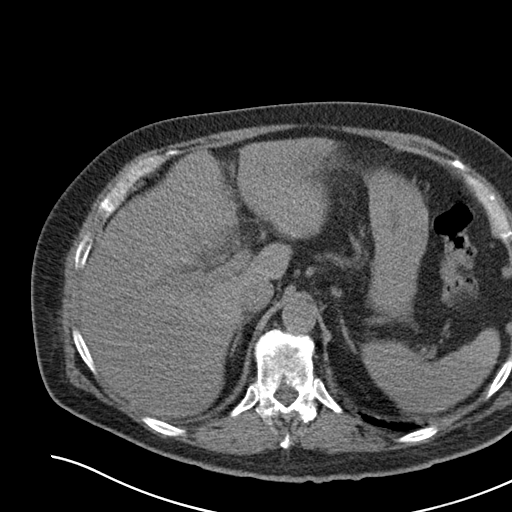
[im 41/48  lung]
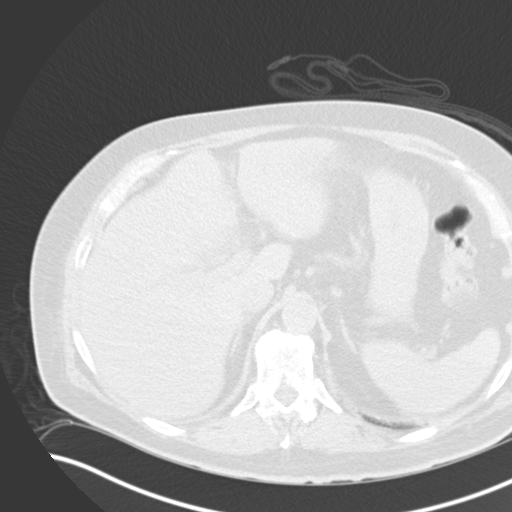
[im 44/48  soft-tissue]
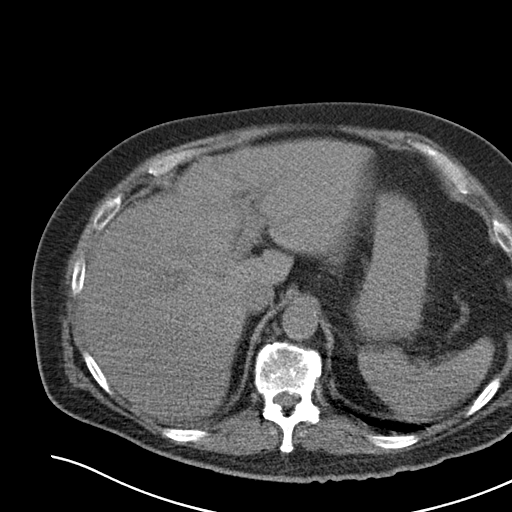
[im 44/48  lung]
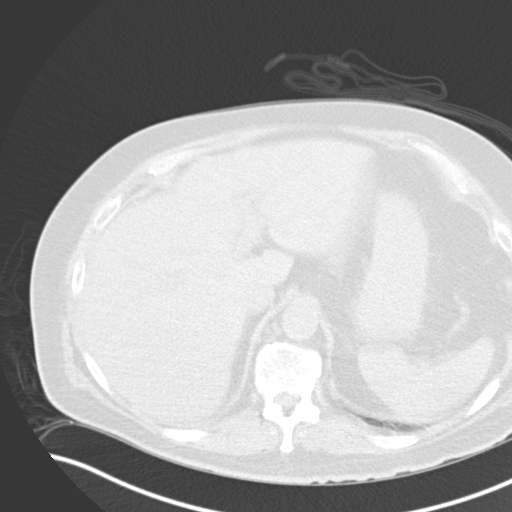

[13 of 32 positions shown; findings below may reference images not displayed]

EXAM:
CT GUIDED PLACEMENT OF PERCUTANEOUS CHOLECYSTOSTOMY TUBE

ANESTHESIA/SEDATION:
1.0 mg IV Versed 100 mcg IV Fentanyl

Total Moderate Sedation Time:  20 minutes

The patient's level of consciousness and physiologic status were
continuously monitored during the procedure by Radiology nursing.

PROCEDURE:
The procedure, risks, benefits, and alternatives were explained to
the patient. Questions regarding the procedure were encouraged and
answered. The patient understands and consents to the procedure. A
time out was performed prior to initiating the procedure.

CT was performed through the upper and mid abdomen in a supine
position. The right abdominal wall was prepped with chlorhexidine in
a sterile fashion, and a sterile drape was applied covering the
operative field. A sterile gown and sterile gloves were used for the
procedure. Local anesthesia was provided with 1% Lidocaine.

Under CT guidance, an 18 gauge trocar needle was advanced into the
gallbladder lumen. After confirming needle tip position, a guidewire
was advanced. The percutaneous tract was dilated and a 10 French
drainage catheter advanced. Catheter position was confirmed by CT.
The catheter was further advanced into the gallbladder lumen. An
aspirated fluid sample was sent for culture analysis. The catheter
was flushed with saline and connected to a gravity drainage bag. It
was secured at the skin with a Prolene retention suture and StatLock
device.

COMPLICATIONS:
None
FINDINGS: Initial unenhanced imaging shows change in appearance of the
gallbladder since the CTA of 04/28/2019 with high density material
seen throughout the gallbladder lumen. After needle placement,
aspiration yielded blood and clot consistent with hemorrhagic fluid.

After 10 French cholecystostomy tube placement, there is return of
grossly hemorrhagic fluid.
IMPRESSION: CT-guided placement of percutaneous cholecystostomy tube with return
of grossly hemorrhagic fluid. A fluid sample was sent for culture
analysis. The catheter was connected to a gravity drainage bag and
will be flushed 3 times daily to try to irrigate the hemorrhagic
fluid which is currently draining very slowly.
